# Patient Record
Sex: Female | Born: 1985 | Race: Asian | Hispanic: No | Marital: Married | Smoking: Never smoker
Health system: Southern US, Community
[De-identification: ages and names within clinical notes are randomized; demographics above are authoritative.]

## PROBLEM LIST (undated history)

## (undated) ENCOUNTER — Inpatient Hospital Stay (HOSPITAL_COMMUNITY): Payer: Self-pay

## (undated) DIAGNOSIS — Z789 Other specified health status: Secondary | ICD-10-CM

## (undated) HISTORY — PX: NO PAST SURGERIES: SHX2092

---

## 2016-11-24 ENCOUNTER — Ambulatory Visit
Admission: RE | Admit: 2016-11-24 | Discharge: 2016-11-24 | Disposition: A | Discharge status: Home / Self Care | Payer: No Typology Code available for payment source | Source: Ambulatory Visit | Attending: Internal Medicine | Admitting: Internal Medicine

## 2016-11-24 ENCOUNTER — Other Ambulatory Visit: Payer: Self-pay | Admitting: Internal Medicine

## 2016-11-24 DIAGNOSIS — R7611 Nonspecific reaction to tuberculin skin test without active tuberculosis: Secondary | ICD-10-CM

## 2018-03-07 LAB — OB RESULTS CONSOLE RUBELLA ANTIBODY, IGM: Rubella: IMMUNE

## 2018-03-07 LAB — CULTURE, OB URINE: Urine Culture, OB: NEGATIVE

## 2018-03-07 LAB — CYSTIC FIBROSIS DIAGNOSTIC STUDY: INTERPRETATION-CFDNA: NEGATIVE

## 2018-03-07 LAB — OB RESULTS CONSOLE HIV ANTIBODY (ROUTINE TESTING): HIV: NONREACTIVE

## 2018-03-07 LAB — OB RESULTS CONSOLE RPR: RPR: NONREACTIVE

## 2018-03-07 LAB — GLUCOSE TOLERANCE, 1 HOUR: GLUCOSE 1 HOUR GTT: 119

## 2018-03-07 LAB — OB RESULTS CONSOLE GC/CHLAMYDIA
Chlamydia: NEGATIVE
Gonorrhea: NEGATIVE

## 2018-03-07 LAB — OB RESULTS CONSOLE ABO/RH: "RH Type ": POSITIVE

## 2018-03-07 LAB — OB RESULTS CONSOLE VARICELLA ZOSTER ANTIBODY, IGG: Varicella: IMMUNE

## 2018-03-07 LAB — OB RESULTS CONSOLE HGB/HCT, BLOOD
HCT: 35 (ref 29–41)
Hemoglobin: 11.8

## 2018-03-07 LAB — SICKLE CELL SCREEN: SICKLE CELL SCREEN: NORMAL

## 2018-03-07 LAB — OB RESULTS CONSOLE ANTIBODY SCREEN: Antibody Screen: NEGATIVE

## 2018-03-07 LAB — OB RESULTS CONSOLE HEPATITIS B SURFACE ANTIGEN: HEP B S AG: NEGATIVE

## 2018-03-07 LAB — CYTOLOGY - PAP: Pap: NEGATIVE

## 2018-03-07 LAB — OB RESULTS CONSOLE PLATELET COUNT: PLATELETS: 229

## 2018-03-25 ENCOUNTER — Other Ambulatory Visit (HOSPITAL_COMMUNITY): Payer: Self-pay | Admitting: Nurse Practitioner

## 2018-03-25 DIAGNOSIS — Z3A20 20 weeks gestation of pregnancy: Secondary | ICD-10-CM

## 2018-03-25 DIAGNOSIS — O280 Abnormal hematological finding on antenatal screening of mother: Secondary | ICD-10-CM

## 2018-03-25 DIAGNOSIS — R1900 Intra-abdominal and pelvic swelling, mass and lump, unspecified site: Secondary | ICD-10-CM

## 2018-03-25 DIAGNOSIS — Z3689 Encounter for other specified antenatal screening: Secondary | ICD-10-CM

## 2018-03-27 ENCOUNTER — Encounter (HOSPITAL_COMMUNITY): Payer: Self-pay | Admitting: *Deleted

## 2018-03-29 ENCOUNTER — Ambulatory Visit (HOSPITAL_COMMUNITY)
Admission: RE | Admit: 2018-03-29 | Discharge: 2018-03-29 | Disposition: A | Discharge status: Home / Self Care | Payer: Medicaid Other | Source: Ambulatory Visit | Attending: Nurse Practitioner | Admitting: Nurse Practitioner

## 2018-03-29 ENCOUNTER — Encounter (HOSPITAL_COMMUNITY): Payer: Self-pay

## 2018-04-05 ENCOUNTER — Ambulatory Visit (HOSPITAL_COMMUNITY): Payer: Self-pay

## 2018-04-05 ENCOUNTER — Other Ambulatory Visit (HOSPITAL_COMMUNITY): Payer: Self-pay | Admitting: Obstetrics and Gynecology

## 2018-04-05 ENCOUNTER — Other Ambulatory Visit (HOSPITAL_COMMUNITY): Payer: Self-pay | Admitting: *Deleted

## 2018-04-05 ENCOUNTER — Other Ambulatory Visit (HOSPITAL_COMMUNITY): Payer: Self-pay

## 2018-04-05 ENCOUNTER — Ambulatory Visit (HOSPITAL_COMMUNITY)
Admission: RE | Admit: 2018-04-05 | Discharge: 2018-04-05 | Disposition: A | Discharge status: Home / Self Care | Payer: Medicaid Other | Source: Ambulatory Visit | Attending: Nurse Practitioner | Admitting: Nurse Practitioner

## 2018-04-05 ENCOUNTER — Other Ambulatory Visit (HOSPITAL_COMMUNITY): Payer: Self-pay | Admitting: Nurse Practitioner

## 2018-04-05 ENCOUNTER — Encounter (HOSPITAL_COMMUNITY): Payer: Self-pay

## 2018-04-05 ENCOUNTER — Other Ambulatory Visit: Payer: Self-pay

## 2018-04-05 ENCOUNTER — Ambulatory Visit (HOSPITAL_BASED_OUTPATIENT_CLINIC_OR_DEPARTMENT_OTHER)
Admission: RE | Admit: 2018-04-05 | Discharge: 2018-04-05 | Disposition: A | Discharge status: Home / Self Care | Payer: Medicaid Other | Source: Ambulatory Visit | Attending: Nurse Practitioner | Admitting: Nurse Practitioner

## 2018-04-05 DIAGNOSIS — Z3689 Encounter for other specified antenatal screening: Secondary | ICD-10-CM

## 2018-04-05 DIAGNOSIS — O289 Unspecified abnormal findings on antenatal screening of mother: Secondary | ICD-10-CM | POA: Diagnosis not present

## 2018-04-05 DIAGNOSIS — Z363 Encounter for antenatal screening for malformations: Secondary | ICD-10-CM | POA: Diagnosis not present

## 2018-04-05 DIAGNOSIS — N1330 Unspecified hydronephrosis: Secondary | ICD-10-CM

## 2018-04-05 DIAGNOSIS — Z3A20 20 weeks gestation of pregnancy: Secondary | ICD-10-CM

## 2018-04-05 DIAGNOSIS — O280 Abnormal hematological finding on antenatal screening of mother: Secondary | ICD-10-CM

## 2018-04-05 DIAGNOSIS — O35EXX0 Maternal care for other (suspected) fetal abnormality and damage, fetal genitourinary anomalies, not applicable or unspecified: Secondary | ICD-10-CM

## 2018-04-05 DIAGNOSIS — Z3A22 22 weeks gestation of pregnancy: Secondary | ICD-10-CM | POA: Insufficient documentation

## 2018-04-05 DIAGNOSIS — O281 Abnormal biochemical finding on antenatal screening of mother: Secondary | ICD-10-CM | POA: Insufficient documentation

## 2018-04-05 DIAGNOSIS — O359XX0 Maternal care for (suspected) fetal abnormality and damage, unspecified, not applicable or unspecified: Secondary | ICD-10-CM

## 2018-04-05 DIAGNOSIS — O358XX0 Maternal care for other (suspected) fetal abnormality and damage, not applicable or unspecified: Secondary | ICD-10-CM

## 2018-04-05 DIAGNOSIS — R1900 Intra-abdominal and pelvic swelling, mass and lump, unspecified site: Secondary | ICD-10-CM

## 2018-04-05 HISTORY — DX: Other specified health status: Z78.9

## 2018-04-08 ENCOUNTER — Other Ambulatory Visit (HOSPITAL_COMMUNITY): Payer: Self-pay

## 2018-04-08 ENCOUNTER — Telehealth (HOSPITAL_COMMUNITY): Payer: Self-pay | Admitting: *Deleted

## 2018-04-08 ENCOUNTER — Other Ambulatory Visit (HOSPITAL_COMMUNITY): Payer: Self-pay | Admitting: Nurse Practitioner

## 2018-04-08 ENCOUNTER — Other Ambulatory Visit (HOSPITAL_COMMUNITY): Payer: Self-pay | Admitting: *Deleted

## 2018-04-08 DIAGNOSIS — O35EXX1 Maternal care for other (suspected) fetal abnormality and damage, fetal genitourinary anomalies, fetus 1: Secondary | ICD-10-CM

## 2018-04-08 DIAGNOSIS — O358XX1 Maternal care for other (suspected) fetal abnormality and damage, fetus 1: Secondary | ICD-10-CM

## 2018-04-08 LAB — HEMOGLOBINOPATHY EVALUATION
HGB S QUANTITAION: 0 %
HGB VARIANT: 0 %
Hgb A2 Quant: 2.5 % (ref 1.8–3.2)
Hgb A: 95.6 % — ABNORMAL LOW (ref 96.4–98.8)
Hgb C: 0 %
Hgb F Quant: 1.9 % (ref 0.0–2.0)

## 2018-04-08 NOTE — Telephone Encounter (Signed)
Name and DOB verified using [place] interpreter [phone].  Normal FISH result given.  Pt voiced understanding.

## 2018-04-11 ENCOUNTER — Encounter (HOSPITAL_COMMUNITY): Payer: Self-pay

## 2018-04-26 ENCOUNTER — Other Ambulatory Visit (HOSPITAL_COMMUNITY): Payer: Self-pay | Admitting: Nurse Practitioner

## 2018-05-03 ENCOUNTER — Encounter (HOSPITAL_COMMUNITY): Payer: Self-pay

## 2018-05-03 ENCOUNTER — Ambulatory Visit (HOSPITAL_COMMUNITY)
Admission: RE | Admit: 2018-05-03 | Discharge: 2018-05-03 | Disposition: A | Discharge status: Home / Self Care | Payer: Medicaid Other | Source: Ambulatory Visit | Attending: Nurse Practitioner | Admitting: Nurse Practitioner

## 2018-05-03 DIAGNOSIS — O289 Unspecified abnormal findings on antenatal screening of mother: Secondary | ICD-10-CM

## 2018-05-03 DIAGNOSIS — O9989 Other specified diseases and conditions complicating pregnancy, childbirth and the puerperium: Secondary | ICD-10-CM | POA: Insufficient documentation

## 2018-05-03 DIAGNOSIS — Z3A26 26 weeks gestation of pregnancy: Secondary | ICD-10-CM | POA: Insufficient documentation

## 2018-05-03 DIAGNOSIS — O358XX0 Maternal care for other (suspected) fetal abnormality and damage, not applicable or unspecified: Secondary | ICD-10-CM | POA: Insufficient documentation

## 2018-05-03 DIAGNOSIS — O35EXX0 Maternal care for other (suspected) fetal abnormality and damage, fetal genitourinary anomalies, not applicable or unspecified: Secondary | ICD-10-CM

## 2018-05-03 DIAGNOSIS — N1330 Unspecified hydronephrosis: Secondary | ICD-10-CM | POA: Diagnosis not present

## 2018-05-03 DIAGNOSIS — O359XX0 Maternal care for (suspected) fetal abnormality and damage, unspecified, not applicable or unspecified: Secondary | ICD-10-CM | POA: Diagnosis not present

## 2018-05-06 ENCOUNTER — Other Ambulatory Visit (HOSPITAL_COMMUNITY): Payer: Self-pay | Admitting: *Deleted

## 2018-05-06 DIAGNOSIS — O281 Abnormal biochemical finding on antenatal screening of mother: Secondary | ICD-10-CM

## 2018-05-17 ENCOUNTER — Inpatient Hospital Stay (HOSPITAL_COMMUNITY)
Admission: AD | Admit: 2018-05-17 | Discharge: 2018-05-17 | Disposition: A | Discharge status: Home / Self Care | Payer: Medicaid Other | Attending: Obstetrics and Gynecology | Admitting: Obstetrics and Gynecology

## 2018-05-17 ENCOUNTER — Encounter (HOSPITAL_COMMUNITY): Payer: Self-pay | Admitting: *Deleted

## 2018-05-17 ENCOUNTER — Other Ambulatory Visit: Payer: Self-pay

## 2018-05-17 DIAGNOSIS — O26893 Other specified pregnancy related conditions, third trimester: Secondary | ICD-10-CM

## 2018-05-17 DIAGNOSIS — R030 Elevated blood-pressure reading, without diagnosis of hypertension: Secondary | ICD-10-CM

## 2018-05-17 DIAGNOSIS — Z3A28 28 weeks gestation of pregnancy: Secondary | ICD-10-CM | POA: Diagnosis not present

## 2018-05-17 DIAGNOSIS — O169 Unspecified maternal hypertension, unspecified trimester: Secondary | ICD-10-CM | POA: Insufficient documentation

## 2018-05-17 DIAGNOSIS — I10 Essential (primary) hypertension: Secondary | ICD-10-CM

## 2018-05-17 LAB — PROTEIN / CREATININE RATIO, URINE
CREATININE, URINE: 24 mg/dL
Total Protein, Urine: <6 mg/dL

## 2018-05-17 LAB — COMPREHENSIVE METABOLIC PANEL
ALK PHOS: 67 U/L (ref 38–126)
ALT: 20 U/L (ref 0–44)
AST: 21 U/L (ref 15–41)
Albumin: 3.5 g/dL (ref 3.5–5.0)
Anion gap: 8 (ref 5–15)
CALCIUM: 8.6 mg/dL — AB (ref 8.9–10.3)
CO2: 21 mmol/L — ABNORMAL LOW (ref 22–32)
CREATININE: 0.37 mg/dL — AB (ref 0.44–1.00)
Chloride: 105 mmol/L (ref 98–111)
Glucose, Bld: 110 mg/dL — ABNORMAL HIGH (ref 70–99)
Potassium: 3 mmol/L — ABNORMAL LOW (ref 3.5–5.1)
Sodium: 134 mmol/L — ABNORMAL LOW (ref 135–145)
Total Bilirubin: 0.4 mg/dL (ref 0.3–1.2)
Total Protein: 7.1 g/dL (ref 6.5–8.1)

## 2018-05-17 LAB — CBC
HEMATOCRIT: 34.6 % — AB (ref 36.0–46.0)
HEMOGLOBIN: 12.1 g/dL (ref 12.0–15.0)
MCH: 31.9 pg (ref 26.0–34.0)
MCHC: 35 g/dL (ref 30.0–36.0)
MCV: 91.3 fL (ref 80.0–100.0)
NRBC: 0 % (ref 0.0–0.2)
PLATELETS: 186 10*3/uL (ref 150–400)
RBC: 3.79 MIL/uL — AB (ref 3.87–5.11)
RDW: 13.9 % (ref 11.5–15.5)
WBC: 11.3 10*3/uL — ABNORMAL HIGH (ref 4.0–10.5)

## 2018-05-17 LAB — GLUCOSE TOLERANCE, 1 HOUR: Glucose, 1 Hour GTT: 140

## 2018-05-17 NOTE — MAU Provider Note (Signed)
  History     CSN: [id number]  Arrival date and time: 05/17/18 1047   First Provider Initiated Contact with Patient 05/17/18 1140      Chief Complaint  Patient presents with  . Headache   Presents from HD for elevated blood pressures This morning in clinic BP was 158/97 and 140/80 Denies any history of high blood pressures No family history of HTN or gestational HTN/PreE Denies HA, vision changes, epigastric or RUQ pain, N/V   Past Medical History:  Diagnosis Date  . Medical history non-contributory     Past Surgical History:  Procedure Laterality Date  . NO PAST SURGERIES      History reviewed. No pertinent family history.  Social History   Tobacco Use  . Smoking status: Never Smoker  . Smokeless tobacco: Never Used  Substance Use Topics  . Alcohol use: Not Currently    Frequency: Never  . Drug use: Never    Allergies: No Known Allergies  Medications Prior to Admission  Medication Sig Dispense Refill Last Dose  . Prenatal Vit-Fe Fumarate-FA (PRENATAL VITAMIN PO) Take by mouth.   Taking    Review of Systems  All other systems reviewed and are negative.  Physical Exam   Blood pressure 125/81, pulse (!) 124, temperature 98.5 F (36.9 C), temperature source Oral, resp. rate 17, weight 51.1 kg, last menstrual period 10/29/2017, SpO2 100 %.  Physical Exam  Constitutional: She is oriented to person, place, and time. She appears well-developed and well-nourished. No distress.  HENT:  Head: Normocephalic and atraumatic.  Eyes: Pupils are equal, round, and reactive to light. Right eye exhibits no discharge. Left eye exhibits no discharge. No scleral icterus.  Cardiovascular: Normal rate.  Respiratory: Effort normal. No respiratory distress.  Neurological: She is alert and oriented to person, place, and time.  Skin: She is not diaphoretic.  Psychiatric: She has a normal mood and affect. Her behavior is normal. Judgment and thought content normal.  Strip:  baseline 150s/mod var/+ acels/no decels/uterine irritability   MAU Course  Procedures  MDM Normal blood pressures during monitoring No symptoms concerning for severe preE Labs with normal AST/ALT/platelets/creatinine and UPC  Assessment and Plan  Elevated blood pressure reading in office with diagnosis of hypertension - Plan: Discharge patient - monitoring and workup WNL - strict return precautions given - follow up as scheduled or PRN    Gwenevere AbbotNimeka Walker Sitar 05/17/2018, 11:44 AM

## 2018-05-17 NOTE — MAU Note (Signed)
At prenatal appt today, BP was high. Denies HA< visual changes, epigastric pain or increase in swelling.

## 2018-05-20 LAB — GLUCOSE TOLERANCE, 3 HOURS
Glucose, GTT - 1 Hour: 129 (ref ?–200)
Glucose, GTT - 2 Hour: 115 (ref ?–140)
Glucose, GTT - 3 Hour: 102 mg/dL (ref ?–140)
Glucose, GTT - Fasting: 74 mg/dL — AB (ref 80–110)

## 2018-05-28 ENCOUNTER — Encounter: Payer: Self-pay | Admitting: General Practice

## 2018-05-28 DIAGNOSIS — O139 Gestational [pregnancy-induced] hypertension without significant proteinuria, unspecified trimester: Secondary | ICD-10-CM | POA: Insufficient documentation

## 2018-05-28 DIAGNOSIS — O0990 Supervision of high risk pregnancy, unspecified, unspecified trimester: Secondary | ICD-10-CM | POA: Insufficient documentation

## 2018-05-28 DIAGNOSIS — O133 Gestational [pregnancy-induced] hypertension without significant proteinuria, third trimester: Secondary | ICD-10-CM

## 2018-05-29 NOTE — L&D Delivery Note (Signed)
Patient: Crystal Powers MRN: [id number]  GBS status: negative, IAP given: not indicated  Patient is a 33 y.o. now G1P1 s/p NSVD at [redacted]w[redacted]d, who was admitted for IOL for gHTN. AROM 13h 61m prior to delivery with clear fluid.    Delivery Note At 9:14 AM a viable female was delivered via Vaginal, Spontaneous (Presentation: cephalic; ROA).  APGAR: 9, 9; weight: pending (appears AGA but small).   Placenta status: intact, 3-vessel cord.  Cord:  with the following complications: none.  Cord pH: not sent.   Head delivered ROA. No nuchal cord present. Shoulder and body delivered in usual fashion. Infant with spontaneous cry, placed on mother's abdomen, dried and bulb suctioned. Cord clamped x 2 after 1-minute delay, and cut by family member. Cord blood drawn. Placenta delivered spontaneously with gentle cord traction. Fundus firm with massage and Pitocin. Perineum inspected and found to have 3A laceration, which was repaired with the assistance of Dr. Alvester Morin with good hemostasis achieved.  Anesthesia:  Epidural Episiotomy: None Lacerations: 3rd degree Suture Repair: 3.0 vicryl Est. Blood Loss (mL):  249  Mom to postpartum.  Baby to Couplet care / Skin to Skin.  Gwenevere Abbot 07/17/2018, 10:04 AM

## 2018-05-30 ENCOUNTER — Encounter: Payer: Self-pay | Admitting: *Deleted

## 2018-05-31 ENCOUNTER — Other Ambulatory Visit (HOSPITAL_COMMUNITY): Payer: Self-pay | Admitting: Maternal & Fetal Medicine

## 2018-05-31 ENCOUNTER — Ambulatory Visit (HOSPITAL_COMMUNITY)
Admission: RE | Admit: 2018-05-31 | Discharge: 2018-05-31 | Disposition: A | Discharge status: Home / Self Care | Payer: Medicaid Other | Source: Ambulatory Visit | Attending: Nurse Practitioner | Admitting: Nurse Practitioner

## 2018-05-31 ENCOUNTER — Encounter (HOSPITAL_COMMUNITY): Payer: Self-pay

## 2018-05-31 ENCOUNTER — Inpatient Hospital Stay (HOSPITAL_COMMUNITY)
Admission: AD | Admit: 2018-05-31 | Discharge: 2018-05-31 | Disposition: A | Discharge status: Home / Self Care | Payer: Medicaid Other | Source: Ambulatory Visit | Attending: Obstetrics & Gynecology | Admitting: Obstetrics & Gynecology

## 2018-05-31 DIAGNOSIS — O281 Abnormal biochemical finding on antenatal screening of mother: Secondary | ICD-10-CM

## 2018-05-31 DIAGNOSIS — O289 Unspecified abnormal findings on antenatal screening of mother: Secondary | ICD-10-CM

## 2018-05-31 DIAGNOSIS — O359XX0 Maternal care for (suspected) fetal abnormality and damage, unspecified, not applicable or unspecified: Secondary | ICD-10-CM

## 2018-05-31 DIAGNOSIS — Z3A30 30 weeks gestation of pregnancy: Secondary | ICD-10-CM | POA: Insufficient documentation

## 2018-05-31 DIAGNOSIS — O0990 Supervision of high risk pregnancy, unspecified, unspecified trimester: Secondary | ICD-10-CM

## 2018-05-31 DIAGNOSIS — O133 Gestational [pregnancy-induced] hypertension without significant proteinuria, third trimester: Secondary | ICD-10-CM

## 2018-05-31 DIAGNOSIS — R030 Elevated blood-pressure reading, without diagnosis of hypertension: Secondary | ICD-10-CM | POA: Diagnosis present

## 2018-05-31 LAB — COMPREHENSIVE METABOLIC PANEL
ALBUMIN: 3.3 g/dL — AB (ref 3.5–5.0)
ALT: 15 U/L (ref 0–44)
ANION GAP: 7 (ref 5–15)
AST: 17 U/L (ref 15–41)
Alkaline Phosphatase: 82 U/L (ref 38–126)
BUN: 7 mg/dL (ref 6–20)
CHLORIDE: 106 mmol/L (ref 98–111)
CO2: 21 mmol/L — AB (ref 22–32)
Calcium: 9.3 mg/dL (ref 8.9–10.3)
Creatinine, Ser: 0.33 mg/dL — ABNORMAL LOW (ref 0.44–1.00)
GFR calc Af Amer: >60 mL/min (ref >60)
GFR calc non Af Amer: >60 mL/min (ref >60)
Glucose, Bld: 106 mg/dL — ABNORMAL HIGH (ref 70–99)
POTASSIUM: 3.3 mmol/L — AB (ref 3.5–5.1)
SODIUM: 134 mmol/L — AB (ref 135–145)
TOTAL PROTEIN: 6.6 g/dL (ref 6.5–8.1)
Total Bilirubin: 0.7 mg/dL (ref 0.3–1.2)

## 2018-05-31 LAB — PROTEIN / CREATININE RATIO, URINE
Creatinine, Urine: 21 mg/dL
Total Protein, Urine: <6 mg/dL

## 2018-05-31 LAB — CBC
HEMATOCRIT: 37.1 % (ref 36.0–46.0)
HEMOGLOBIN: 12.6 g/dL (ref 12.0–15.0)
MCH: 31.7 pg (ref 26.0–34.0)
MCHC: 34 g/dL (ref 30.0–36.0)
MCV: 93.5 fL (ref 80.0–100.0)
Platelets: 204 10*3/uL (ref 150–400)
RBC: 3.97 MIL/uL (ref 3.87–5.11)
RDW: 13.7 % (ref 11.5–15.5)
WBC: 10.1 10*3/uL (ref 4.0–10.5)
nRBC: 0 % (ref 0.0–0.2)

## 2018-05-31 LAB — ABO/RH: ABO/RH(D): O POS

## 2018-05-31 LAB — TYPE AND SCREEN
ABO/RH(D): O POS
Antibody Screen: NEGATIVE

## 2018-05-31 LAB — URINALYSIS, ROUTINE W REFLEX MICROSCOPIC
Bilirubin Urine: NEGATIVE
Glucose, UA: NEGATIVE mg/dL
Hgb urine dipstick: NEGATIVE
Ketones, ur: NEGATIVE mg/dL
Leukocytes, UA: NEGATIVE
Nitrite: NEGATIVE
PROTEIN: NEGATIVE mg/dL
Specific Gravity, Urine: 1.005 (ref 1.005–1.030)
pH: 7 (ref 5.0–8.0)

## 2018-05-31 MED ORDER — LABETALOL HCL 5 MG/ML IV SOLN: 80.0000 mg | INTRAVENOUS | Status: DC | PRN

## 2018-05-31 MED ORDER — LABETALOL HCL 5 MG/ML IV SOLN: 20.0000 mg | INTRAVENOUS | Status: DC | PRN

## 2018-05-31 MED ORDER — HYDRALAZINE HCL 20 MG/ML IJ SOLN: 10.0000 mg | INTRAMUSCULAR | Status: DC | PRN

## 2018-05-31 MED ORDER — LABETALOL HCL 5 MG/ML IV SOLN: 40.0000 mg | INTRAVENOUS | Status: DC | PRN

## 2018-05-31 NOTE — Discharge Instructions (Signed)
.  Preeclampsia and Eclampsia    Preeclampsia is a serious condition that may develop during pregnancy. It is also called toxemia of pregnancy. This condition causes high blood pressure along with other symptoms, such as swelling and headaches. These symptoms may develop as the condition gets worse. Preeclampsia may occur at 20 weeks of pregnancy or later.  Diagnosing and treating preeclampsia early is very important. If not treated early, it can cause serious problems for you and your baby. One problem it can lead to is eclampsia. Eclampsia is a condition that causes muscle jerking or shaking (convulsions or seizures) and other serious problems for the mother. During pregnancy, delivering your baby may be the best treatment for preeclampsia or eclampsia. For most women, preeclampsia and eclampsia symptoms go away after giving birth.  In rare cases, a woman may develop preeclampsia after giving birth (postpartum preeclampsia). This usually occurs within 48 hours after childbirth but may occur up to 6 weeks after giving birth.  What are the causes?  The cause of preeclampsia is not known.  What increases the risk?  The following risk factors make you more likely to develop preeclampsia:   Being pregnant for the first time.   Having had preeclampsia during a past pregnancy.   Having a family history of preeclampsia.   Having high blood pressure.   Being pregnant with more than one baby.   Being 35 or older.   Being African-American.   Having kidney disease or diabetes.   Having medical conditions such as lupus or blood diseases.   Being very overweight (obese).  What are the signs or symptoms?  The earliest signs of preeclampsia are:   High blood pressure.   Increased protein in your urine. Your health care provider will check for this at every visit before you give birth (prenatal visit).  Other symptoms that may develop as the condition gets worse include:   Severe headaches.   Sudden weight  gain.   Swelling of the hands, face, legs, and feet.   Nausea and vomiting.   Vision problems, such as blurred or double vision.   Numbness in the face, arms, legs, and feet.   Urinating less than usual.   Dizziness.   Slurred speech.   Abdominal pain, especially upper abdominal pain.   Convulsions or seizures.  How is this diagnosed?  There are no screening tests for preeclampsia. Your health care provider will ask you about symptoms and check for signs of preeclampsia during your prenatal visits. You may also have tests that include:   Urine tests.   Blood tests.   Checking your blood pressure.   Monitoring your baby's heart rate.   Ultrasound.  How is this treated?  You and your health care provider will determine the treatment approach that is best for you. Treatment may include:   Having more frequent prenatal exams to check for signs of preeclampsia, if you have an increased risk for preeclampsia.   Medicine to lower your blood pressure.   Staying in the hospital, if your condition is severe. There, treatment will focus on controlling your blood pressure and the amount of fluids in your body (fluid retention).   Taking medicine (magnesium sulfate) to prevent seizures. This may be given as an injection or through an IV.   Taking a low-dose aspirin during your pregnancy.   Delivering your baby early, if your condition gets worse. You may have your labor started with medicine (induced), or you may have a cesarean   delivery.  Follow these instructions at home:  Eating and drinking     Drink enough fluid to keep your urine pale yellow.   Avoid caffeine.  Lifestyle   Do not use any products that contain nicotine or tobacco, such as cigarettes and e-cigarettes. If you need help quitting, ask your health care provider.   Do not use alcohol or drugs.   Avoid stress as much as possible. Rest and get plenty of sleep.  General instructions   Take over-the-counter and prescription medicines only as  told by your health care provider.   When lying down, lie on your left side. This keeps pressure off your major blood vessels.   When sitting or lying down, raise (elevate) your feet. Try putting some pillows underneath your lower legs.   Exercise regularly. Ask your health care provider what kinds of exercise are best for you.   Keep all follow-up and prenatal visits as told by your health care provider. This is important.  How is this prevented?  There is no known way of preventing preeclampsia or eclampsia from developing. However, to lower your risk of complications and detect problems early:   Get regular prenatal care. Your health care provider may be able to diagnose and treat the condition early.   Maintain a healthy weight. Ask your health care provider for help managing weight gain during pregnancy.   Work with your health care provider to manage any long-term (chronic) health conditions you have, such as diabetes or kidney problems.   You may have tests of your blood pressure and kidney function after giving birth.   Your health care provider may have you take low-dose aspirin during your next pregnancy.  Contact a health care provider if:   You have symptoms that your health care provider told you may require more treatment or monitoring, such as:  ? Headaches.  ? Nausea or vomiting.  ? Abdominal pain.  ? Dizziness.  ? Light-headedness.  Get help right away if:   You have severe:  ? Abdominal pain.  ? Headaches that do not get better.  ? Dizziness.  ? Vision problems.  ? Confusion.  ? Nausea or vomiting.   You have any of the following:  ? A seizure.  ? Sudden, rapid weight gain.  ? Sudden swelling in your hands, ankles, or face.  ? Trouble moving any part of your body.  ? Numbness in any part of your body.  ? Trouble speaking.  ? Abnormal bleeding.   You faint.  Summary   Preeclampsia is a serious condition that may develop during pregnancy. It is also called toxemia of pregnancy.   This  condition causes high blood pressure along with other symptoms, such as swelling and headaches.   Diagnosing and treating preeclampsia early is very important. If not treated early, it can cause serious problems for you and your baby.   Get help right away if you have symptoms that your health care provider told you to watch for.  This information is not intended to replace advice given to you by your health care provider. Make sure you discuss any questions you have with your health care provider.  Document Released: 05/12/2000 Document Revised: 05/01/2017 Document Reviewed: 12/20/2015  Elsevier Interactive Patient Education  2019 [employer]vier Inc[employer]

## 2018-05-31 NOTE — MAU Note (Signed)
Report given to Ginger Morris, RN in MAU.  Pt transported via wheelchair for further evaluation per Dr. Judeth Cornfield in [facility].

## 2018-05-31 NOTE — MAU Provider Note (Signed)
History     CSN: [id number]  Arrival date and time: 05/31/18 1509   First Provider Initiated Contact with Patient 05/31/18 1537      Chief Complaint  Patient presents with  . Hypertension   HPI Crystal Powers is a 33 y.o. G1P0000 at [redacted]w[redacted]d who presents from MFM for evaluation of elevated BP. She denies any headache, visual changes or epigastric pain. She had severe range BP at her appointment. She denies any abdominal pain, vaginal bleeding or leaking of fluid. Reports normal fetal movement.   OB History    Gravida  1   Para  0   Term  0   Preterm  0   AB  0   Living  0     SAB  0   TAB  0   Ectopic  0   Multiple  0   Live Births  0           Past Medical History:  Diagnosis Date  . Medical history non-contributory     Past Surgical History:  Procedure Laterality Date  . NO PAST SURGERIES      Family History  Problem Relation Age of Onset  . Hypertension Mother     Social History   Tobacco Use  . Smoking status: Never Smoker  . Smokeless tobacco: Never Used  Substance Use Topics  . Alcohol use: Not Currently    Frequency: Never  . Drug use: Never    Allergies: No Known Allergies  Medications Prior to Admission  Medication Sig Dispense Refill Last Dose  . Prenatal Vit-Fe Fumarate-FA (PRENATAL VITAMIN PO) Take by mouth.   Taking    Review of Systems  Constitutional: Negative.  Negative for fatigue and fever.  HENT: Negative.   Respiratory: Negative.  Negative for shortness of breath.   Cardiovascular: Negative.  Negative for chest pain.  Gastrointestinal: Negative.  Negative for abdominal pain, constipation, diarrhea, nausea and vomiting.  Genitourinary: Negative.  Negative for dysuria.  Neurological: Negative.  Negative for dizziness and headaches.   Physical Exam   Blood pressure (!) 130/95, pulse (!) 122, last menstrual period 10/29/2017, SpO2 95 %. Patient Vitals for the past 24 hrs:  BP Pulse SpO2  05/31/18 1631 117/76 (!)  117 -  05/31/18 1615 112/76 (!) 111 96 %  05/31/18 1610 - - 96 %  05/31/18 1605 - - 96 %  05/31/18 1600 114/80 (!) 119 95 %  05/31/18 1555 - - 95 %  05/31/18 1546 112/79 (!) 107 -  05/31/18 1545 - - 96 %  05/31/18 1535 - - 95 %  05/31/18 1530 (!) 130/95 (!) 122 96 %  05/31/18 1525 132/89 (!) 116 96 %  05/31/18 1524 - - 97 %   Physical Exam  Nursing note and vitals reviewed. Constitutional: She is oriented to person, place, and time. She appears well-developed and well-nourished. No distress.  HENT:  Head: Normocephalic.  Eyes: Pupils are equal, round, and reactive to light.  Cardiovascular: Normal rate, regular rhythm and normal heart sounds.  Respiratory: Effort normal and breath sounds normal. No respiratory distress.  GI: Soft. Bowel sounds are normal. She exhibits no distension. There is no abdominal tenderness.  Neurological: She is alert and oriented to person, place, and time. She has normal reflexes. She displays normal reflexes. She exhibits normal muscle tone. Coordination normal.  Skin: Skin is warm and dry.  Psychiatric: She has a normal mood and affect. Her behavior is normal. Judgment and thought content normal.  Fetal Tracing:  Baseline: 150 Variability: moderate Accels: 10x10 Decels: none  Toco: none  MAU Course  Procedures Results for orders placed or performed during the hospital encounter of 05/31/18 (from the past 24 hour(s))  Protein / creatinine ratio, urine     Status: None   Collection Time: 05/31/18  3:21 PM  Result Value Ref Range   Creatinine, Urine 21.00 mg/dL   Total Protein, Urine <6 mg/dL   Protein Creatinine Ratio        0.00 - 0.15 mg/mg[Cre]  Urinalysis, Routine w reflex microscopic     Status: Abnormal   Collection Time: 05/31/18  3:21 PM  Result Value Ref Range   Color, Urine STRAW (A) YELLOW   APPearance CLEAR CLEAR   Specific Gravity, Urine 1.005 1.005 - 1.030   pH 7.0 5.0 - 8.0   Glucose, UA NEGATIVE NEGATIVE mg/dL   Hgb  urine dipstick NEGATIVE NEGATIVE   Bilirubin Urine NEGATIVE NEGATIVE   Ketones, ur NEGATIVE NEGATIVE mg/dL   Protein, ur NEGATIVE NEGATIVE mg/dL   Nitrite NEGATIVE NEGATIVE   Leukocytes, UA NEGATIVE NEGATIVE  Comprehensive metabolic panel     Status: Abnormal   Collection Time: 05/31/18  3:30 PM  Result Value Ref Range   Sodium 134 (L) 135 - 145 mmol/L   Potassium 3.3 (L) 3.5 - 5.1 mmol/L   Chloride 106 98 - 111 mmol/L   CO2 21 (L) 22 - 32 mmol/L   Glucose, Bld 106 (H) 70 - 99 mg/dL   BUN 7 6 - 20 mg/dL   Creatinine, Ser 6.60 (L) 0.44 - 1.00 mg/dL   Calcium 9.3 8.9 - 63.0 mg/dL   Total Protein 6.6 6.5 - 8.1 g/dL   Albumin 3.3 (L) 3.5 - 5.0 g/dL   AST 17 15 - 41 U/L   ALT 15 0 - 44 U/L   Alkaline Phosphatase 82 38 - 126 U/L   Total Bilirubin 0.7 0.3 - 1.2 mg/dL   GFR calc non Af Amer >60 >60 mL/min   GFR calc Af Amer >60 >60 mL/min   Anion gap 7 5 - 15  CBC     Status: None   Collection Time: 05/31/18  3:30 PM  Result Value Ref Range   WBC 10.1 4.0 - 10.5 K/uL   RBC 3.97 3.87 - 5.11 MIL/uL   Hemoglobin 12.6 12.0 - 15.0 g/dL   HCT 16.0 10.9 - 32.3 %   MCV 93.5 80.0 - 100.0 fL   MCH 31.7 26.0 - 34.0 pg   MCHC 34.0 30.0 - 36.0 g/dL   RDW 55.7 32.2 - 02.5 %   Platelets 204 150 - 400 K/uL   nRBC 0.0 0.0 - 0.2 %  Type and screen     Status: None   Collection Time: 05/31/18  3:30 PM  Result Value Ref Range   ABO/RH(D) O POS    Antibody Screen NEG    Sample Expiration      06/03/2018 Performed at [facility], [address] [place], [place] [address]    MDM UA CBC, CMP, Protein/creat ratio  Assessment and Plan   1. [redacted] weeks gestation of pregnancy   2. Gestational hypertension, third trimester    -Discharge home in stable condition -Preeclampsia precautions discussed -Patient advised to follow-up with [facility] as scheduled for prenatal care -Patient may return to MAU as needed or if her condition were to change or worsen  Rolm Bookbinder CNM 05/31/2018, 3:37 PM

## 2018-05-31 NOTE — MAU Note (Addendum)
Pt here for hypertension and tachycardia. Sent from office. No HA, visual changes, or RUQ pain.

## 2018-06-03 ENCOUNTER — Ambulatory Visit (INDEPENDENT_AMBULATORY_CARE_PROVIDER_SITE_OTHER): Payer: Medicaid Other | Admitting: Obstetrics and Gynecology

## 2018-06-03 ENCOUNTER — Encounter: Payer: Self-pay | Admitting: Obstetrics and Gynecology

## 2018-06-03 VITALS — BP 143/92 | HR 110 | Wt 115.2 lb

## 2018-06-03 DIAGNOSIS — Z3A31 31 weeks gestation of pregnancy: Secondary | ICD-10-CM

## 2018-06-03 DIAGNOSIS — O358XX0 Maternal care for other (suspected) fetal abnormality and damage, not applicable or unspecified: Secondary | ICD-10-CM

## 2018-06-03 DIAGNOSIS — Z789 Other specified health status: Secondary | ICD-10-CM

## 2018-06-03 DIAGNOSIS — O133 Gestational [pregnancy-induced] hypertension without significant proteinuria, third trimester: Secondary | ICD-10-CM | POA: Diagnosis not present

## 2018-06-03 DIAGNOSIS — O0993 Supervision of high risk pregnancy, unspecified, third trimester: Secondary | ICD-10-CM | POA: Diagnosis not present

## 2018-06-03 DIAGNOSIS — O99810 Abnormal glucose complicating pregnancy: Secondary | ICD-10-CM | POA: Insufficient documentation

## 2018-06-03 DIAGNOSIS — O0990 Supervision of high risk pregnancy, unspecified, unspecified trimester: Secondary | ICD-10-CM

## 2018-06-03 DIAGNOSIS — O35EXX0 Maternal care for other (suspected) fetal abnormality and damage, fetal genitourinary anomalies, not applicable or unspecified: Secondary | ICD-10-CM | POA: Insufficient documentation

## 2018-06-03 NOTE — Progress Notes (Signed)
Called [facility] and spoke with Linus Orn requesting the rest of the pt's medical records.  Last records sent 04/26/18.  Additional records to be faxed per Linus Orn.

## 2018-06-03 NOTE — Patient Instructions (Signed)
AREA PEDIATRIC/FAMILY PRACTICE PHYSICIANS  [facility] [address], [address] [place], [place]  [address] Phone - [phone]   Fax - [phone]  [facility] [address] [place], [place] [address] Phone - [phone]   Fax - [phone]  [facility] [address] [place], [place]  [address] Phone - [phone]   Fax - [phone]  [facility] [address] [place], [place]  [address] Phone - [phone]   Fax - [phone]  [facility] [address] [place], [place]  [address] Phone - [phone]   Fax - [phone]  [facility] [address] [place], [place]  [address] Phone - [phone]   Fax - [phone]  [facility] [address] [place], [place]  [address] Phone - [phone]   Fax - [phone]  [facility] [address], [address] [place], [place]  [address] Phone - [phone]   Fax - [phone]  [facility] [address] [place], [place]  [address] Phone - [phone]   Fax - [phone] [facility] [address] [place], [place]  [address] Phone - [phone]   Fax - [phone]  [facility] [address] [place], [place]  [address] Phone - [phone]   Fax - [phone]  [facility] [address] [place], [place]  [address] Phone - [phone]   Fax - [phone]  [facility] [address] [place], [place]  [address] Phone - [phone]   Fax - [phone]  [facility] [address] [place], [place]  [address] Phone - [phone]  [facility] [address] [place], [place]  [address] Phone - [phone]   Fax - [phone]  [facility] [address] [place], [place]  [address] Phone - [phone]   Fax - [phone]  [facility] [address] [place], [place]  [address] Phone - [phone]   Fax - [phone]  [facility] [address] [place], [place]  [address] Phone - [phone]   Fax - [phone]   [facility] [address] [place], [place]  [address] Phone - [phone]   Fax - [phone]  [facility] [address] [place], [place]  [address] Phone - [phone]   Fax - [phone]  [facility] R[facility] [address] [place], [place]  [address] Phone - [phone]   Fax - [phone]  [facility] [address] [place], [place]  [address] Phone - [phone]   Fax - [phone]  [facility] [address] , [place]  [address] Phone - [phone]   Fax - [phone] [facility] [address] [place], [place]  [address] Phone - [phone]   Fax - [phone]  [facility] [address] [place], [place]  [address] Phone - [phone]   Fax - [phone]  [facility] [address] [place], [place]  [address] Phone - [phone]   Fax - [phone]  [facility] Charlene Flemming MD [address] [place] [address] Phone [phone]  Fax [phone]   

## 2018-06-03 NOTE — Progress Notes (Signed)
Prenatal Visit Note Date: 06/03/2018 Clinic: [facility]  Transfer of care visit from [facility] due to [facility] diagnosis  Subjective:  Crystal Powers is a 33 y.o. G1P0000 at [redacted]w[redacted]d being seen today for ongoing prenatal care.  She is currently monitored for the following issues for this high-risk pregnancy and has Supervision of high risk pregnancy, antepartum; Gestational hypertension; Left pyelectasis of fetus on prenatal ultrasound; Abnormal glucose affecting pregnancy; and Language barrier on their problem list.  Patient reports no complaints.   Contractions: Not present. Vag. Bleeding: None.  Movement: Present. Denies leaking of fluid.   The following portions of the patient's history were reviewed and updated as appropriate: allergies, current medications, past family history, past medical history, past social history, past surgical history and problem list. Problem list updated.  Objective:   Vitals:   06/03/18 0826 06/03/18 0827  BP: (!) 142/94 (!) 143/92  Pulse: (!) 156 (!) 153  Weight: 115 lb 3.2 oz (52.3 kg)     Fetal Status: Fetal Heart Rate (bpm): 140   Movement: Present     General:  Alert, oriented and cooperative. Patient is in no acute distress.  Skin: Skin is warm and dry. No rash noted.   Cardiovascular: Normal heart rate noted  Respiratory: Normal respiratory effort, no problems with respiration noted  Abdomen: Soft, gravid, appropriate for gestational age. Pain/Pressure: Absent   No ruq pain   Pelvic:  Cervical exam deferred        Extremities: Normal range of motion.  Edema: None  Mental Status: Normal mood and affect. Normal behavior. Normal judgment and thought content.   Urinalysis:      Assessment and Plan:  Pregnancy: G1P0000 at [redacted]w[redacted]d  1. Language barrier interpereter used  2. Supervision of high risk pregnancy, antepartum Routine care. 28wk labs normal  3. Gestational hypertension, third trimester D/w her re: diagnosis and plan of  care. Had growth, bpp last week. Rpt on 1/10 along with labs which I told her I recommend weekly, as well. I told her that if she only has gHTN then delivery would be at 37wks. I told her s/s to be on the look out for and to come to hospital if has any  4. Left pyelectasis of fetus on prenatal ultrasound F/u on rpt u/s  Preterm labor symptoms and general obstetric precautions including but not limited to vaginal bleeding, contractions, leaking of fluid and fetal movement were reviewed in detail with the patient. Please refer to After Visit Summary for other counseling recommendations.  Return in about 4 days (around 06/07/2018) for hrob, nst, bpp, labwork.   Arbuckle Bing, MD

## 2018-06-04 ENCOUNTER — Encounter: Payer: Self-pay | Admitting: *Deleted

## 2018-06-06 ENCOUNTER — Ambulatory Visit (INDEPENDENT_AMBULATORY_CARE_PROVIDER_SITE_OTHER): Payer: Medicaid Other | Admitting: Obstetrics & Gynecology

## 2018-06-06 ENCOUNTER — Other Ambulatory Visit: Payer: Medicaid Other

## 2018-06-06 ENCOUNTER — Ambulatory Visit (INDEPENDENT_AMBULATORY_CARE_PROVIDER_SITE_OTHER): Payer: Medicaid Other | Admitting: *Deleted

## 2018-06-06 ENCOUNTER — Ambulatory Visit: Payer: Self-pay

## 2018-06-06 VITALS — BP 148/93 | HR 146 | Wt 115.2 lb

## 2018-06-06 DIAGNOSIS — Z758 Other problems related to medical facilities and other health care: Secondary | ICD-10-CM

## 2018-06-06 DIAGNOSIS — Z789 Other specified health status: Secondary | ICD-10-CM

## 2018-06-06 DIAGNOSIS — O0993 Supervision of high risk pregnancy, unspecified, third trimester: Secondary | ICD-10-CM | POA: Diagnosis present

## 2018-06-06 DIAGNOSIS — O133 Gestational [pregnancy-induced] hypertension without significant proteinuria, third trimester: Secondary | ICD-10-CM

## 2018-06-06 DIAGNOSIS — Z3A31 31 weeks gestation of pregnancy: Secondary | ICD-10-CM | POA: Diagnosis not present

## 2018-06-06 DIAGNOSIS — O0990 Supervision of high risk pregnancy, unspecified, unspecified trimester: Secondary | ICD-10-CM

## 2018-06-06 NOTE — Progress Notes (Signed)
   PRENATAL VISIT NOTE  Subjective:  Crystal Powers is a 33 y.o. G1P0000 at [redacted]w[redacted]d being seen today for ongoing prenatal care.  She is currently monitored for the following issues for this high-risk pregnancy and has Supervision of high risk pregnancy, antepartum; Gestational hypertension; Left pyelectasis of fetus on prenatal ultrasound; Abnormal glucose affecting pregnancy; and Language barrier on their problem list.  Patient reports no complaints.  Contractions: Not present. Vag. Bleeding: None.  Movement: Present. Denies leaking of fluid.   The following portions of the patient's history were reviewed and updated as appropriate: allergies, current medications, past family history, past medical history, past social history, past surgical history and problem list. Problem list updated.  Objective:   Vitals:   06/06/18 0830  BP: (!) 148/93  Pulse: (!) 146  Weight: 115 lb 3.2 oz (52.3 kg)    Fetal Status: Fetal Heart Rate (bpm): NST Fundal Height: 30 cm Movement: Present     General:  Alert, oriented and cooperative. Patient is in no acute distress.  Skin: Skin is warm and dry. No rash noted.   Cardiovascular: Normal heart rate noted  Respiratory: Normal respiratory effort, no problems with respiration noted  Abdomen: Soft, gravid, appropriate for gestational age.  Pain/Pressure: Absent     Pelvic: Cervical exam deferred        Extremities: Normal range of motion.  Edema: None  Mental Status: Normal mood and affect. Normal behavior. Normal judgment and thought content.   Assessment and Plan:  Pregnancy: G1P0000 at [redacted]w[redacted]d  1. Supervision of high risk pregnancy, antepartum   2. Gestational hypertension, third trimester - labetalol 200 mg BID prescribed - CBC, Cmet today - weekly BPP - MFM u/s in about 3 weeks  3. Language barrier - live interpretor present for exam  Preterm labor symptoms and general obstetric precautions including but not limited to vaginal bleeding,  contractions, leaking of fluid and fetal movement were reviewed in detail with the patient. Please refer to After Visit Summary for other counseling recommendations.  Return in about 1 week (around 06/13/2018) for weekly HOB and NST/BPP.  Future Appointments  Date Time Provider Department Center  06/06/2018 11:00 AM [facility] LAB [facility] WOC    Allie Bossier, MD

## 2018-06-06 NOTE — Progress Notes (Signed)
Live interpreter present for encounter. Pt states she is aware of her fast heart rate and this happens several times per week. She denies chest pain or SOB.  Pt also denies H/A or visual disturbances.

## 2018-06-06 NOTE — Progress Notes (Signed)
I have reviewed this chart and agree with the RN/CMA assessment and management.    Devani Odonnel C Garold Sheeler, MD, FACOG Attending Physician, Faculty Practice [facility]  

## 2018-06-06 NOTE — Progress Notes (Signed)

## 2018-06-07 LAB — COMPREHENSIVE METABOLIC PANEL
ALT: 11 IU/L (ref 0–32)
AST: 14 IU/L (ref 0–40)
Albumin/Globulin Ratio: 1.5 (ref 1.2–2.2)
Albumin: 3.9 g/dL (ref 3.5–5.5)
Alkaline Phosphatase: 103 IU/L (ref 39–117)
BUN/Creatinine Ratio: 13 (ref 9–23)
BUN: 5 mg/dL — ABNORMAL LOW (ref 6–20)
Bilirubin Total: 0.2 mg/dL (ref 0.0–1.2)
CO2: 21 mmol/L (ref 20–29)
Calcium: 9.9 mg/dL (ref 8.7–10.2)
Chloride: 102 mmol/L (ref 96–106)
Creatinine, Ser: 0.39 mg/dL — ABNORMAL LOW (ref 0.57–1.00)
GFR calc Af Amer: 158 mL/min/{1.73_m2} (ref >59)
GFR calc non Af Amer: 137 mL/min/{1.73_m2} (ref >59)
Globulin, Total: 2.6 g/dL (ref 1.5–4.5)
Glucose: 73 mg/dL (ref 65–99)
Potassium: 3.5 mmol/L (ref 3.5–5.2)
Sodium: 138 mmol/L (ref 134–144)
Total Protein: 6.5 g/dL (ref 6.0–8.5)

## 2018-06-07 LAB — CBC
Hematocrit: 36.5 % (ref 34.0–46.6)
Hemoglobin: 12.6 g/dL (ref 11.1–15.9)
MCH: 31.7 pg (ref 26.6–33.0)
MCHC: 34.5 g/dL (ref 31.5–35.7)
MCV: 92 fL (ref 79–97)
Platelets: 196 10*3/uL (ref 150–450)
RBC: 3.97 x10E6/uL (ref 3.77–5.28)
RDW: 13.1 % (ref 11.7–15.4)
WBC: 11.5 10*3/uL — ABNORMAL HIGH (ref 3.4–10.8)

## 2018-06-11 ENCOUNTER — Ambulatory Visit (HOSPITAL_COMMUNITY): Payer: Medicaid Other

## 2018-06-13 ENCOUNTER — Encounter: Payer: Self-pay | Admitting: Obstetrics and Gynecology

## 2018-06-13 ENCOUNTER — Other Ambulatory Visit: Payer: Medicaid Other

## 2018-06-13 ENCOUNTER — Ambulatory Visit (INDEPENDENT_AMBULATORY_CARE_PROVIDER_SITE_OTHER): Payer: Medicaid Other | Admitting: *Deleted

## 2018-06-13 ENCOUNTER — Other Ambulatory Visit (HOSPITAL_COMMUNITY): Payer: Self-pay | Admitting: Obstetrics and Gynecology

## 2018-06-13 ENCOUNTER — Encounter (HOSPITAL_COMMUNITY): Payer: Self-pay

## 2018-06-13 ENCOUNTER — Ambulatory Visit (HOSPITAL_COMMUNITY)
Admission: RE | Admit: 2018-06-13 | Discharge: 2018-06-13 | Disposition: A | Discharge status: Home / Self Care | Payer: Medicaid Other | Source: Ambulatory Visit | Attending: Nurse Practitioner | Admitting: Nurse Practitioner

## 2018-06-13 ENCOUNTER — Other Ambulatory Visit (HOSPITAL_COMMUNITY): Payer: Self-pay | Admitting: *Deleted

## 2018-06-13 ENCOUNTER — Ambulatory Visit (INDEPENDENT_AMBULATORY_CARE_PROVIDER_SITE_OTHER): Payer: Medicaid Other | Admitting: Obstetrics and Gynecology

## 2018-06-13 VITALS — BP 145/102 | HR 115 | Wt 115.7 lb

## 2018-06-13 DIAGNOSIS — O139 Gestational [pregnancy-induced] hypertension without significant proteinuria, unspecified trimester: Secondary | ICD-10-CM

## 2018-06-13 DIAGNOSIS — O35EXX1 Maternal care for other (suspected) fetal abnormality and damage, fetal genitourinary anomalies, fetus 1: Secondary | ICD-10-CM

## 2018-06-13 DIAGNOSIS — Z3A32 32 weeks gestation of pregnancy: Secondary | ICD-10-CM

## 2018-06-13 DIAGNOSIS — O133 Gestational [pregnancy-induced] hypertension without significant proteinuria, third trimester: Secondary | ICD-10-CM | POA: Diagnosis not present

## 2018-06-13 DIAGNOSIS — O358XX1 Maternal care for other (suspected) fetal abnormality and damage, fetus 1: Secondary | ICD-10-CM | POA: Diagnosis not present

## 2018-06-13 DIAGNOSIS — O0993 Supervision of high risk pregnancy, unspecified, third trimester: Secondary | ICD-10-CM

## 2018-06-13 DIAGNOSIS — O0990 Supervision of high risk pregnancy, unspecified, unspecified trimester: Secondary | ICD-10-CM

## 2018-06-13 NOTE — Progress Notes (Signed)
Subjective:  Crystal Powers is a 33 y.o. G1P0000 at 106w3d being seen today for ongoing prenatal care.  She is currently monitored for the following issues for this high-risk pregnancy and has Supervision of high risk pregnancy, antepartum; Gestational hypertension; Left pyelectasis of fetus on prenatal ultrasound; Abnormal glucose affecting pregnancy; and Language barrier on their problem list.  Patient reports no complaints.  Contractions: Not present. Vag. Bleeding: None.  Movement: Present. Denies leaking of fluid.   The following portions of the patient's history were reviewed and updated as appropriate: allergies, current medications, past family history, past medical history, past social history, past surgical history and problem list. Problem list updated.  Objective:   Vitals:   06/13/18 0949 06/13/18 0956  BP: (!) 140/95 (!) 145/102  Pulse: (!) 115   Weight: 115 lb 11.2 oz (52.5 kg)     Fetal Status: Fetal Heart Rate (bpm): 153   Movement: Present     General:  Alert, oriented and cooperative. Patient is in no acute distress.  Skin: Skin is warm and dry. No rash noted.   Cardiovascular: Normal heart rate noted  Respiratory: Normal respiratory effort, no problems with respiration noted  Abdomen: Soft, gravid, appropriate for gestational age. Pain/Pressure: Absent     Pelvic:  Cervical exam deferred        Extremities: Normal range of motion.  Edema: None  Mental Status: Normal mood and affect. Normal behavior. Normal judgment and thought content.   Urinalysis:      Assessment and Plan:  Pregnancy: G1P0000 at [redacted]w[redacted]d  1. Supervision of high risk pregnancy, antepartum Stable   2. Gestational hypertension, third trimester No S/Sx of PEC GHTN reviewed with pt, including IOL at 37 weeks Pt not on medications. Explained to pt no indication for BP medication presently S/Sx of PEC reviewed with pt and indications to present to MAU Growth scan 32% 05/31/18 BPP/NSt today Continue  with weekly antenatal testing  Live interrupter used during today's visit  Preterm labor symptoms and general obstetric precautions including but not limited to vaginal bleeding, contractions, leaking of fluid and fetal movement were reviewed in detail with the patient. Please refer to After Visit Summary for other counseling recommendations.  Return in about 1 week (around 06/20/2018) for [facility] and NST/BPP.   Hermina Staggers, MD

## 2018-06-14 LAB — PROTEIN / CREATININE RATIO, URINE
Creatinine, Urine: 23.6 mg/dL
Protein, Ur: 6.4 mg/dL
Protein/Creat Ratio: 271 mg/g creat — ABNORMAL HIGH (ref 0–200)

## 2018-06-19 ENCOUNTER — Ambulatory Visit (INDEPENDENT_AMBULATORY_CARE_PROVIDER_SITE_OTHER): Payer: Medicaid Other | Admitting: Obstetrics & Gynecology

## 2018-06-19 ENCOUNTER — Ambulatory Visit (INDEPENDENT_AMBULATORY_CARE_PROVIDER_SITE_OTHER): Payer: Medicaid Other | Admitting: *Deleted

## 2018-06-19 ENCOUNTER — Ambulatory Visit: Payer: Self-pay

## 2018-06-19 VITALS — BP 142/87 | HR 124 | Wt 116.0 lb

## 2018-06-19 DIAGNOSIS — O133 Gestational [pregnancy-induced] hypertension without significant proteinuria, third trimester: Secondary | ICD-10-CM

## 2018-06-19 DIAGNOSIS — O0993 Supervision of high risk pregnancy, unspecified, third trimester: Secondary | ICD-10-CM | POA: Diagnosis not present

## 2018-06-19 DIAGNOSIS — Z3A33 33 weeks gestation of pregnancy: Secondary | ICD-10-CM | POA: Diagnosis not present

## 2018-06-19 DIAGNOSIS — O358XX0 Maternal care for other (suspected) fetal abnormality and damage, not applicable or unspecified: Secondary | ICD-10-CM | POA: Diagnosis not present

## 2018-06-19 DIAGNOSIS — O0990 Supervision of high risk pregnancy, unspecified, unspecified trimester: Secondary | ICD-10-CM

## 2018-06-19 DIAGNOSIS — O35EXX0 Maternal care for other (suspected) fetal abnormality and damage, fetal genitourinary anomalies, not applicable or unspecified: Secondary | ICD-10-CM

## 2018-06-19 NOTE — Progress Notes (Signed)
Pt declined interpreter today. She denies H/A or visual disturbances. She states her BP's at home are wnl daily.

## 2018-06-19 NOTE — Progress Notes (Signed)
   PRENATAL VISIT NOTE  Subjective:  Crystal Powers is a 33 y.o. G1P0000 at [redacted]w[redacted]d being seen today for ongoing prenatal care.  She is currently monitored for the following issues for this high-risk pregnancy and has Supervision of high risk pregnancy, antepartum; Gestational hypertension; Left pyelectasis of fetus on prenatal ultrasound; Abnormal glucose affecting pregnancy; and Language barrier on their problem list.  Patient reports no complaints. Patient denies any headaches, visual symptoms, RUQ/epigastric pain.   Contractions: Not present. Vag. Bleeding: None.  Movement: Present. Denies leaking of fluid.   The following portions of the patient's history were reviewed and updated as appropriate: allergies, current medications, past family history, past medical history, past social history, past surgical history and problem list. Problem list updated.  Objective:   Vitals:   06/19/18 0845 06/19/18 0846 06/19/18 0916  BP: (!) 155/94 (!) 159/90 (!) 142/87  Pulse: (!) 141 (!) 149 (!) 124  Weight: 116 lb (52.6 kg)      Fetal Status: Fetal Heart Rate (bpm): NST   Movement: Present     General:  Alert, oriented and cooperative. Patient is in no acute distress.  Skin: Skin is warm and dry. No rash noted.   Cardiovascular: Normal heart rate noted  Respiratory: Normal respiratory effort, no problems with respiration noted  Abdomen: Soft, gravid, appropriate for gestational age.  Pain/Pressure: Absent     Pelvic: Cervical exam deferred        Extremities: Normal range of motion.  Edema: None  Mental Status: Normal mood and affect. Normal behavior. Normal judgment and thought content.   Assessment and Plan:  Pregnancy: G1P0000 at [redacted]w[redacted]d  1. Gestational hypertension, third trimester Will recheck labs today, UPC was 271 last week. Severe PEC precautions advised. - CBC - Comprehensive metabolic panel - Protein / creatinine ratio, urine NST performed today was reviewed and was found to be  reactive. Subsequent BPP performed today was also reviewed and was found to be 8/10 (negative for BM). AFI was also normal. Continue recommended antenatal testing and prenatal care.  2. Left pyelectasis of fetus on prenatal ultrasound Follow up scan ordered  3. Supervision of high risk pregnancy, antepartum Preterm labor symptoms and general obstetric precautions including but not limited to vaginal bleeding, contractions, leaking of fluid and fetal movement were reviewed in detail with the patient. Please refer to After Visit Summary for other counseling recommendations.  Return in about 1 week (around 06/26/2018) for *Needs urine each* Please schedule HOB w/Raiyah Speakman @ 1035 and NST @ 1115 (double book) - has [place] @ 0845.  Future Appointments  Date Time Provider Department Center  06/26/2018  8:45 AM WH-MFC [place] 5 WH-MFCUS MFC-US  06/26/2018 10:35 AM Iyona Pehrson, Jethro Bastos, MD WOC-[facility]A WOC  06/26/2018 11:15 AM WOC-[facility] NST WOC-[facility]A WOC    Jaynie Collins, MD

## 2018-06-19 NOTE — Patient Instructions (Signed)
Return to clinic for any scheduled appointments or obstetric concerns, or go to MAU for evaluation  

## 2018-06-19 NOTE — Progress Notes (Signed)
Pt informed that the ultrasound is considered a limited OB ultrasound and is not intended to be a complete ultrasound exam.  Patient also informed that the ultrasound is not being completed with the intent of assessing for fetal or placental anomalies or any pelvic abnormalities.  Explained that the purpose of today's ultrasound is to assess for presentation, BPP and amniotic fluid voume.  Patient acknowledges the purpose of the exam and the limitations of the study.

## 2018-06-20 LAB — CBC
HEMOGLOBIN: 13 g/dL (ref 11.1–15.9)
Hematocrit: 36.8 % (ref 34.0–46.6)
MCH: 32.2 pg (ref 26.6–33.0)
MCHC: 35.3 g/dL (ref 31.5–35.7)
MCV: 91 fL (ref 79–97)
Platelets: 197 10*3/uL (ref 150–450)
RBC: 4.04 x10E6/uL (ref 3.77–5.28)
RDW: 13.2 % (ref 11.7–15.4)
WBC: 10.4 10*3/uL (ref 3.4–10.8)

## 2018-06-20 LAB — COMPREHENSIVE METABOLIC PANEL
ALT: 8 IU/L (ref 0–32)
AST: 10 IU/L (ref 0–40)
Albumin/Globulin Ratio: 1.4 (ref 1.2–2.2)
Albumin: 3.6 g/dL — ABNORMAL LOW (ref 3.8–4.8)
Alkaline Phosphatase: 113 IU/L (ref 39–117)
BUN / CREAT RATIO: 10 (ref 9–23)
BUN: 4 mg/dL — ABNORMAL LOW (ref 6–20)
Bilirubin Total: <0.2 mg/dL (ref 0.0–1.2)
CO2: 21 mmol/L (ref 20–29)
Calcium: 9.3 mg/dL (ref 8.7–10.2)
Chloride: 104 mmol/L (ref 96–106)
Creatinine, Ser: 0.41 mg/dL — ABNORMAL LOW (ref 0.57–1.00)
GFR calc non Af Amer: 135 mL/min/{1.73_m2} (ref >59)
GFR, EST AFRICAN AMERICAN: 156 mL/min/{1.73_m2} (ref >59)
Globulin, Total: 2.6 g/dL (ref 1.5–4.5)
Glucose: 83 mg/dL (ref 65–99)
Potassium: 3.2 mmol/L — ABNORMAL LOW (ref 3.5–5.2)
Sodium: 141 mmol/L (ref 134–144)
Total Protein: 6.2 g/dL (ref 6.0–8.5)

## 2018-06-20 LAB — PROTEIN / CREATININE RATIO, URINE
Creatinine, Urine: 30 mg/dL
Protein, Ur: 5.6 mg/dL
Protein/Creat Ratio: 187 mg/g creat (ref 0–200)

## 2018-06-26 ENCOUNTER — Other Ambulatory Visit: Payer: Medicaid Other

## 2018-06-26 ENCOUNTER — Inpatient Hospital Stay (HOSPITAL_COMMUNITY)
Admission: AD | Admit: 2018-06-26 | Discharge: 2018-06-26 | Disposition: A | Discharge status: Home / Self Care | Payer: Medicaid Other | Source: Ambulatory Visit | Attending: Obstetrics and Gynecology | Admitting: Obstetrics and Gynecology

## 2018-06-26 ENCOUNTER — Ambulatory Visit (HOSPITAL_COMMUNITY)
Admission: RE | Admit: 2018-06-26 | Discharge: 2018-06-26 | Disposition: A | Discharge status: Home / Self Care | Payer: Medicaid Other | Source: Ambulatory Visit | Attending: Maternal & Fetal Medicine | Admitting: Maternal & Fetal Medicine

## 2018-06-26 ENCOUNTER — Other Ambulatory Visit: Payer: Self-pay

## 2018-06-26 ENCOUNTER — Encounter (HOSPITAL_COMMUNITY): Payer: Self-pay | Admitting: *Deleted

## 2018-06-26 ENCOUNTER — Encounter: Payer: Medicaid Other | Admitting: Obstetrics & Gynecology

## 2018-06-26 ENCOUNTER — Encounter (HOSPITAL_COMMUNITY): Payer: Self-pay

## 2018-06-26 DIAGNOSIS — Z3A34 34 weeks gestation of pregnancy: Secondary | ICD-10-CM

## 2018-06-26 DIAGNOSIS — O133 Gestational [pregnancy-induced] hypertension without significant proteinuria, third trimester: Secondary | ICD-10-CM | POA: Insufficient documentation

## 2018-06-26 DIAGNOSIS — Z362 Encounter for other antenatal screening follow-up: Secondary | ICD-10-CM

## 2018-06-26 DIAGNOSIS — Z3689 Encounter for other specified antenatal screening: Secondary | ICD-10-CM

## 2018-06-26 DIAGNOSIS — R030 Elevated blood-pressure reading, without diagnosis of hypertension: Secondary | ICD-10-CM | POA: Diagnosis present

## 2018-06-26 DIAGNOSIS — O0990 Supervision of high risk pregnancy, unspecified, unspecified trimester: Secondary | ICD-10-CM | POA: Insufficient documentation

## 2018-06-26 LAB — COMPREHENSIVE METABOLIC PANEL
ALT: 14 U/L (ref 0–44)
AST: 19 U/L (ref 15–41)
Albumin: 3.3 g/dL — ABNORMAL LOW (ref 3.5–5.0)
Alkaline Phosphatase: 107 U/L (ref 38–126)
Anion gap: 10 (ref 5–15)
BUN: 6 mg/dL (ref 6–20)
CO2: 22 mmol/L (ref 22–32)
Calcium: 9.5 mg/dL (ref 8.9–10.3)
Chloride: 105 mmol/L (ref 98–111)
Creatinine, Ser: 0.33 mg/dL — ABNORMAL LOW (ref 0.44–1.00)
GFR calc Af Amer: >60 mL/min (ref >60)
GFR calc non Af Amer: >60 mL/min (ref >60)
Glucose, Bld: 102 mg/dL — ABNORMAL HIGH (ref 70–99)
Potassium: 3.1 mmol/L — ABNORMAL LOW (ref 3.5–5.1)
Sodium: 137 mmol/L (ref 135–145)
Total Bilirubin: 0.5 mg/dL (ref 0.3–1.2)
Total Protein: 7.3 g/dL (ref 6.5–8.1)

## 2018-06-26 LAB — CBC
HCT: 38.9 % (ref 36.0–46.0)
Hemoglobin: 13.5 g/dL (ref 12.0–15.0)
MCH: 31.9 pg (ref 26.0–34.0)
MCHC: 34.7 g/dL (ref 30.0–36.0)
MCV: 92 fL (ref 80.0–100.0)
Platelets: 170 10*3/uL (ref 150–400)
RBC: 4.23 MIL/uL (ref 3.87–5.11)
RDW: 13.8 % (ref 11.5–15.5)
WBC: 9.1 10*3/uL (ref 4.0–10.5)
nRBC: 0 % (ref 0.0–0.2)

## 2018-06-26 LAB — PROTEIN / CREATININE RATIO, URINE
Creatinine, Urine: 24 mg/dL
Total Protein, Urine: <6 mg/dL

## 2018-06-26 NOTE — MAU Provider Note (Signed)
History     CSN: [id number]  Arrival date and time: 06/26/18 [id number]   First Provider Initiated Contact with Patient 06/26/18 1027      Chief Complaint  Patient presents with  . Hypertension  . Tachycardia   G1 @34 .2 wks sent from MFM for elevated BP. Denies HA, visual disturbances, RUQ pain, SOB, and CP. Reports good FM. No VB, LOF, or ctx. Dx with gHTN around 28 wks.  OB History    Gravida  1   Para  0   Term  0   Preterm  0   AB  0   Living  0     SAB  0   TAB  0   Ectopic  0   Multiple  0   Live Births  0           Past Medical History:  Diagnosis Date  . Medical history non-contributory     Past Surgical History:  Procedure Laterality Date  . NO PAST SURGERIES      Family History  Problem Relation Age of Onset  . Hypertension Mother     Social History   Tobacco Use  . Smoking status: Never Smoker  . Smokeless tobacco: Never Used  Substance Use Topics  . Alcohol use: Not Currently    Frequency: Never  . Drug use: Never    Allergies: No Known Allergies  Medications Prior to Admission  Medication Sig Dispense Refill Last Dose  . Prenatal Vit-Fe Fumarate-FA (PRENATAL VITAMIN PO) Take 1 tablet by mouth daily.    Taking    Review of Systems  Eyes: Negative for visual disturbance.  Respiratory: Negative for shortness of breath.   Cardiovascular: Negative for leg swelling.  Gastrointestinal: Negative for abdominal pain.  Genitourinary: Negative for vaginal bleeding and vaginal discharge.  Neurological: Negative for headaches.   Physical Exam   Blood pressure 139/89, pulse (!) 119, temperature 98.6 F (37 C), temperature source Oral, resp. rate 16, last menstrual period 10/29/2017, SpO2 98 %.  Physical Exam  Nursing note and vitals reviewed. Constitutional: She is oriented to person, place, and time. She appears well-developed and well-nourished. No distress.  HENT:  Head: Normocephalic and atraumatic.  Neck: Normal range of  motion.  Respiratory: Effort normal. No respiratory distress.  Musculoskeletal: Normal range of motion.  Neurological: She is alert and oriented to person, place, and time.  Psychiatric: She has a normal mood and affect.  EFM: 150 bpm, mod variability, + accels, rare variable decels Toco: rare  Results for orders placed or performed during the hospital encounter of 06/26/18 (from the past 24 hour(s))  CBC     Status: None   Collection Time: 06/26/18 10:15 AM  Result Value Ref Range   WBC 9.1 4.0 - 10.5 K/uL   RBC 4.23 3.87 - 5.11 MIL/uL   Hemoglobin 13.5 12.0 - 15.0 g/dL   HCT 56.238.9 13.036.0 - 86.546.0 %   MCV 92.0 80.0 - 100.0 fL   MCH 31.9 26.0 - 34.0 pg   MCHC 34.7 30.0 - 36.0 g/dL   RDW 78.413.8 69.611.5 - 29.515.5 %   Platelets 170 150 - 400 K/uL   nRBC 0.0 0.0 - 0.2 %   MAU Course  Procedures Orders Placed This Encounter  Procedures  . CBC    Standing Status:   Standing    Number of Occurrences:   1  . Comprehensive metabolic panel    Standing Status:   Standing    Number of  Occurrences:   1  . Protein / creatinine ratio, urine    Standing Status:   Standing    Number of Occurrences:   1   MDM Labs ordered and reviewed. No evidence of PEC. Stable for discharge home. Needs twice weekly AT and reschedule missed appt- message sent.  Assessment and Plan   1. [redacted] weeks gestation of pregnancy   2. Gestational hypertension, third trimester   3. NST (non-stress test) reactive    Discharge home Follow up in [facility] PEC precautions  Allergies as of 06/26/2018   No Known Allergies     Medication List    TAKE these medications   PRENATAL VITAMIN PO Take 1 tablet by mouth daily.      Donette Larry, CNM 06/26/2018, 10:29 AM

## 2018-06-26 NOTE — ED Notes (Signed)
Report called to [facility], RN in MAU.  Pt to MAU for evaluation of elevated BP and pulse.

## 2018-06-26 NOTE — MAU Note (Signed)
Pt sent from MFM for elevated BP & heart rate.  Has had high BP "for a while" now.  Denies HA or blurred vision.  Denies contractions, bleeding or LOF.  Reports good fetal movement.

## 2018-06-26 NOTE — Discharge Instructions (Signed)
Hypertension During Pregnancy ° °Hypertension is also called high blood pressure. High blood pressure means that the force of your blood moving in your body is too strong. When you are pregnant, this condition should be watched carefully. It can cause problems for you and your baby. °Follow these instructions at home: °Eating and drinking ° °· Drink enough fluid to keep your pee (urine) pale yellow. °· Avoid caffeine. °Lifestyle °· Do not use any products that contain nicotine or tobacco, such as cigarettes and e-cigarettes. If you need help quitting, ask your doctor. °· Do not use alcohol or drugs. °· Avoid stress. °· Rest and get plenty of sleep. °General instructions °· Take over-the-counter and prescription medicines only as told by your doctor. °· While lying down, lie on your left side. This keeps pressure off your major blood vessels. °· While sitting or lying down, raise (elevate) your feet. Try putting some pillows under your lower legs. °· Exercise regularly. Ask your doctor what kinds of exercise are best for you. °· Keep all prenatal and follow-up visits as told by your doctor. This is important. °Contact a doctor if: °· You have symptoms that your doctor told you to watch for, such as: °? Throwing up (vomiting). °? Feeling sick to your stomach (nausea). °? Headache. °Get help right away if you have: °· Very bad belly pain that does not get better with treatment. °· A very bad headache that does not get better. °· Throwing up that does not get better with treatment. °· Sudden, fast weight gain. °· Sudden swelling in your hands, ankles, or face. °· Bleeding from your vagina. °· Blood in your pee. °· Fewer movements from your baby than usual. °· Blurry vision. °· Double vision. °· Muscle twitching. °· Sudden muscle tightening (spasms). °· Trouble breathing. °· Blue fingernails or lips. °Summary °· Hypertension is also called high blood pressure. High blood pressure means that the force of your blood moving  in your body is too strong. °· When you are pregnant, this condition should be watched carefully. It can cause problems for you and your baby. °· Get help right away if you have symptoms that your doctor told you to watch for. °This information is not intended to replace advice given to you by your health care provider. Make sure you discuss any questions you have with your health care provider. °Document Released: 06/17/2010 Document Revised: 05/01/2017 Document Reviewed: 01/25/2016 °Elsevier Interactive Patient Education © 2019 [employer] ° °

## 2018-06-28 ENCOUNTER — Ambulatory Visit (HOSPITAL_COMMUNITY): Payer: Medicaid Other

## 2018-07-03 ENCOUNTER — Ambulatory Visit (INDEPENDENT_AMBULATORY_CARE_PROVIDER_SITE_OTHER): Payer: Medicaid Other | Admitting: Obstetrics & Gynecology

## 2018-07-03 ENCOUNTER — Ambulatory Visit (INDEPENDENT_AMBULATORY_CARE_PROVIDER_SITE_OTHER): Payer: Medicaid Other | Admitting: *Deleted

## 2018-07-03 ENCOUNTER — Ambulatory Visit: Payer: Self-pay

## 2018-07-03 ENCOUNTER — Other Ambulatory Visit (HOSPITAL_COMMUNITY)
Admission: RE | Admit: 2018-07-03 | Discharge: 2018-07-03 | Disposition: A | Discharge status: Home / Self Care | Payer: Medicaid Other | Source: Ambulatory Visit | Attending: Obstetrics & Gynecology | Admitting: Obstetrics & Gynecology

## 2018-07-03 VITALS — BP 134/94 | HR 140 | Wt 117.0 lb

## 2018-07-03 DIAGNOSIS — O0990 Supervision of high risk pregnancy, unspecified, unspecified trimester: Secondary | ICD-10-CM | POA: Insufficient documentation

## 2018-07-03 DIAGNOSIS — O133 Gestational [pregnancy-induced] hypertension without significant proteinuria, third trimester: Secondary | ICD-10-CM

## 2018-07-03 DIAGNOSIS — O0993 Supervision of high risk pregnancy, unspecified, third trimester: Secondary | ICD-10-CM

## 2018-07-03 DIAGNOSIS — O99810 Abnormal glucose complicating pregnancy: Secondary | ICD-10-CM

## 2018-07-03 DIAGNOSIS — Z3A35 35 weeks gestation of pregnancy: Secondary | ICD-10-CM | POA: Diagnosis not present

## 2018-07-03 DIAGNOSIS — Z789 Other specified health status: Secondary | ICD-10-CM

## 2018-07-03 LAB — POCT URINALYSIS DIP (DEVICE)
Bilirubin Urine: NEGATIVE
Glucose, UA: NEGATIVE mg/dL
Hgb urine dipstick: NEGATIVE
KETONES UR: NEGATIVE mg/dL
Leukocytes, UA: NEGATIVE
Nitrite: NEGATIVE
PROTEIN: NEGATIVE mg/dL
SPECIFIC GRAVITY, URINE: 1.01 (ref 1.005–1.030)
Urobilinogen, UA: 0.2 mg/dL (ref 0.0–1.0)
pH: 7 (ref 5.0–8.0)

## 2018-07-03 LAB — OB RESULTS CONSOLE GBS: GBS: NEGATIVE

## 2018-07-03 NOTE — Progress Notes (Signed)
   PRENATAL VISIT NOTE  Subjective:  Crystal Powers is a 33 y.o. G1P0000 at [redacted]w[redacted]d being seen today for ongoing prenatal care.  She is currently monitored for the following issues for this low-risk pregnancy and has Supervision of high risk pregnancy, antepartum; Gestational hypertension; Left pyelectasis of fetus on prenatal ultrasound; Abnormal glucose affecting pregnancy; and Language barrier on their problem list.  Patient reports no complaints.  Contractions: Not present. Vag. Bleeding: None.  Movement: Present. Denies leaking of fluid.   The following portions of the patient's history were reviewed and updated as appropriate: allergies, current medications, past family history, past medical history, past social history, past surgical history and problem list. Problem list updated.  Objective:   Vitals:   07/03/18 1324 07/03/18 1403  BP: (!) 150/91 (!) 134/94  Pulse: (!) 161 (!) 140  Weight: 117 lb (53.1 kg)     Fetal Status: Fetal Heart Rate (bpm): NST   Movement: Present     General:  Alert, oriented and cooperative. Patient is in no acute distress.  Skin: Skin is warm and dry. No rash noted.   Cardiovascular: Normal heart rate noted  Respiratory: Normal respiratory effort, no problems with respiration noted  Abdomen: Soft, gravid, appropriate for gestational age.  Pain/Pressure: Absent     Pelvic: Cervical exam performed        Extremities: Normal range of motion.     Mental Status: Normal mood and affect. Normal behavior. Normal judgment and thought content.   Assessment and Plan:  Pregnancy: G1P0000 at [redacted]w[redacted]d  1. Supervision of high risk pregnancy, antepartum  - Culture, beta strep (group b only) - GC/Chlamydia probe amp ()not at [facility]  2. Gestational hypertension, third trimester - she denies any symptoms - CBC - Comprehensive metabolic panel - Protein / creatinine ratio, urine - IOL for 37 weeks  3. Language barrier - Live interpretor present  4.  Abnormal glucose affecting pregnancy - normal 3 hour GTT  Preterm labor symptoms and general obstetric precautions including but not limited to vaginal bleeding, contractions, leaking of fluid and fetal movement were reviewed in detail with the patient. Please refer to After Visit Summary for other counseling recommendations.  Return in about 1 week (around 07/10/2018) for [facility] and NST/BPP.  Future Appointments  Date Time Provider Department Center  07/15/2018  6:30 AM WH-BSSCHED ROOM WH-BSSCHED None    Allie Bossier, MD

## 2018-07-03 NOTE — Progress Notes (Signed)
Interpreter Lanna PocheBhumika Gautam present for encounter. Pt states she is slightly aware of fast HR but not bothersome. She denies SOB, H/A, RUQ pain or visual disturbances . IOL scheduled on 2/17@ 0630.   Pt informed that the ultrasound is considered a limited OB ultrasound and is not intended to be a complete ultrasound exam.  Patient also informed that the ultrasound is not being completed with the intent of assessing for fetal or placental anomalies or any pelvic abnormalities.  Explained that the purpose of today's ultrasound is to assess for presentation, BPP and amniotic fluid volume.  Patient acknowledges the purpose of the exam and the limitations of the study.

## 2018-07-03 NOTE — Progress Notes (Signed)
I have reviewed this chart and agree with the RN/CMA assessment and management.    Kira Hartl C Yajayra Feldt, MD, FACOG Attending Physician, Faculty Practice [facility]  

## 2018-07-04 LAB — COMPREHENSIVE METABOLIC PANEL
A/G RATIO: 1.4 (ref 1.2–2.2)
ALBUMIN: 3.7 g/dL — AB (ref 3.8–4.8)
ALT: 11 IU/L (ref 0–32)
AST: 17 IU/L (ref 0–40)
Alkaline Phosphatase: 128 IU/L — ABNORMAL HIGH (ref 39–117)
BUN/Creatinine Ratio: 14 (ref 9–23)
BUN: 6 mg/dL (ref 6–20)
Bilirubin Total: 0.3 mg/dL (ref 0.0–1.2)
CO2: 20 mmol/L (ref 20–29)
Calcium: 9.4 mg/dL (ref 8.7–10.2)
Chloride: 97 mmol/L (ref 96–106)
Creatinine, Ser: 0.42 mg/dL — ABNORMAL LOW (ref 0.57–1.00)
GFR calc Af Amer: 155 mL/min/{1.73_m2} (ref >59)
GFR calc non Af Amer: 134 mL/min/{1.73_m2} (ref >59)
GLOBULIN, TOTAL: 2.7 g/dL (ref 1.5–4.5)
Glucose: 97 mg/dL (ref 65–99)
POTASSIUM: 3.4 mmol/L — AB (ref 3.5–5.2)
Sodium: 141 mmol/L (ref 134–144)
Total Protein: 6.4 g/dL (ref 6.0–8.5)

## 2018-07-04 LAB — CBC
Hematocrit: 37.4 % (ref 34.0–46.6)
Hemoglobin: 13.5 g/dL (ref 11.1–15.9)
MCH: 32.4 pg (ref 26.6–33.0)
MCHC: 36.1 g/dL — AB (ref 31.5–35.7)
MCV: 90 fL (ref 79–97)
Platelets: 215 10*3/uL (ref 150–450)
RBC: 4.17 x10E6/uL (ref 3.77–5.28)
RDW: 12.9 % (ref 11.7–15.4)
WBC: 9.7 10*3/uL (ref 3.4–10.8)

## 2018-07-04 LAB — GC/CHLAMYDIA PROBE AMP (~~LOC~~) NOT AT ARMC
Chlamydia: NEGATIVE
Neisseria Gonorrhea: NEGATIVE

## 2018-07-07 LAB — PROTEIN / CREATININE RATIO, URINE
Creatinine, Urine: 21.2 mg/dL
Protein, Ur: <4 mg/dL

## 2018-07-07 LAB — CULTURE, BETA STREP (GROUP B ONLY): Strep Gp B Culture: NEGATIVE

## 2018-07-08 ENCOUNTER — Other Ambulatory Visit: Payer: Self-pay | Admitting: Family Medicine

## 2018-07-09 ENCOUNTER — Telehealth (HOSPITAL_COMMUNITY): Payer: Self-pay | Admitting: *Deleted

## 2018-07-09 NOTE — Telephone Encounter (Signed)
Preadmission screen Interpreter number [phone]

## 2018-07-11 ENCOUNTER — Ambulatory Visit: Payer: Self-pay

## 2018-07-11 ENCOUNTER — Ambulatory Visit (INDEPENDENT_AMBULATORY_CARE_PROVIDER_SITE_OTHER): Payer: Medicaid Other | Admitting: *Deleted

## 2018-07-11 ENCOUNTER — Encounter: Payer: Self-pay | Admitting: Obstetrics and Gynecology

## 2018-07-11 ENCOUNTER — Ambulatory Visit (INDEPENDENT_AMBULATORY_CARE_PROVIDER_SITE_OTHER): Payer: Medicaid Other | Admitting: Obstetrics and Gynecology

## 2018-07-11 VITALS — BP 144/86 | HR 142 | Wt 119.1 lb

## 2018-07-11 DIAGNOSIS — Z3A36 36 weeks gestation of pregnancy: Secondary | ICD-10-CM | POA: Diagnosis not present

## 2018-07-11 DIAGNOSIS — O133 Gestational [pregnancy-induced] hypertension without significant proteinuria, third trimester: Secondary | ICD-10-CM

## 2018-07-11 DIAGNOSIS — O0990 Supervision of high risk pregnancy, unspecified, unspecified trimester: Secondary | ICD-10-CM

## 2018-07-11 DIAGNOSIS — O0993 Supervision of high risk pregnancy, unspecified, third trimester: Secondary | ICD-10-CM

## 2018-07-11 LAB — POCT URINALYSIS DIP (DEVICE)
Bilirubin Urine: NEGATIVE
Glucose, UA: NEGATIVE mg/dL
Hgb urine dipstick: NEGATIVE
Ketones, ur: NEGATIVE mg/dL
Leukocytes,Ua: NEGATIVE
Nitrite: NEGATIVE
Protein, ur: NEGATIVE mg/dL
Specific Gravity, Urine: 1.015 (ref 1.005–1.030)
Urobilinogen, UA: 0.2 mg/dL (ref 0.0–1.0)
pH: 7 (ref 5.0–8.0)

## 2018-07-11 NOTE — Progress Notes (Signed)
Subjective:  Crystal Powers is a 33 y.o. G1P0000 at [redacted]w[redacted]d being seen today for ongoing prenatal care.  She is currently monitored for the following issues for this high-risk pregnancy and has Supervision of high risk pregnancy, antepartum; Gestational hypertension; Abnormal glucose affecting pregnancy; and Language barrier on their problem list.  Patient reports no complaints.  Contractions: Not present. Vag. Bleeding: None.  Movement: Present. Denies leaking of fluid.   The following portions of the patient's history were reviewed and updated as appropriate: allergies, current medications, past family history, past medical history, past social history, past surgical history and problem list. Problem list updated.  Objective:   Vitals:   07/11/18 0905  BP: (!) 144/86  Pulse: (!) 142  Weight: 119 lb 1.6 oz (54 kg)    Fetal Status: Fetal Heart Rate (bpm): NST   Movement: Present     General:  Alert, oriented and cooperative. Patient is in no acute distress.  Skin: Skin is warm and dry. No rash noted.   Cardiovascular: Normal heart rate noted  Respiratory: Normal respiratory effort, no problems with respiration noted  Abdomen: Soft, gravid, appropriate for gestational age. Pain/Pressure: Absent     Pelvic:  Cervical exam deferred        Extremities: Normal range of motion.     Mental Status: Normal mood and affect. Normal behavior. Normal judgment and thought content.   Urinalysis:      Assessment and Plan:  Pregnancy: G1P0000 at [redacted]w[redacted]d  1. Supervision of high risk pregnancy, antepartum Stable  2. Gestational hypertension, third trimester BP stable BPP 10/10 today IOL on Monday  Term labor symptoms and general obstetric precautions including but not limited to vaginal bleeding, contractions, leaking of fluid and fetal movement were reviewed in detail with the patient. Please refer to After Visit Summary for other counseling recommendations.  Return in about 5 weeks (around  08/15/2018) for PP visit.  IOL on 2/17.   Hermina Staggers, MD

## 2018-07-11 NOTE — Progress Notes (Signed)
Pt is scheduled for IOL on 2/17 @ 0630.   Pt informed that the ultrasound is considered a limited OB ultrasound and is not intended to be a complete ultrasound exam.  Patient also informed that the ultrasound is not being completed with the intent of assessing for fetal or placental anomalies or any pelvic abnormalities.  Explained that the purpose of today's ultrasound is to assess for presentation, BPP and amniotic fluid volume.  Patient acknowledges the purpose of the exam and the limitations of the study.

## 2018-07-15 ENCOUNTER — Inpatient Hospital Stay (HOSPITAL_COMMUNITY)
Admission: RE | Admit: 2018-07-15 | Discharge: 2018-07-19 | DRG: 768 | Disposition: A | Discharge status: Home / Self Care | Payer: Medicaid Other | Attending: Family Medicine | Admitting: Family Medicine

## 2018-07-15 ENCOUNTER — Encounter (HOSPITAL_COMMUNITY): Payer: Self-pay

## 2018-07-15 DIAGNOSIS — Z3A37 37 weeks gestation of pregnancy: Secondary | ICD-10-CM

## 2018-07-15 DIAGNOSIS — O134 Gestational [pregnancy-induced] hypertension without significant proteinuria, complicating childbirth: Secondary | ICD-10-CM | POA: Diagnosis present

## 2018-07-15 DIAGNOSIS — O99814 Abnormal glucose complicating childbirth: Secondary | ICD-10-CM | POA: Diagnosis present

## 2018-07-15 DIAGNOSIS — Z3490 Encounter for supervision of normal pregnancy, unspecified, unspecified trimester: Secondary | ICD-10-CM | POA: Diagnosis present

## 2018-07-15 DIAGNOSIS — O139 Gestational [pregnancy-induced] hypertension without significant proteinuria, unspecified trimester: Secondary | ICD-10-CM | POA: Diagnosis present

## 2018-07-15 DIAGNOSIS — O99810 Abnormal glucose complicating pregnancy: Secondary | ICD-10-CM | POA: Diagnosis present

## 2018-07-15 LAB — CBC
HCT: 39.2 % (ref 36.0–46.0)
Hemoglobin: 13.6 g/dL (ref 12.0–15.0)
MCH: 31.9 pg (ref 26.0–34.0)
MCHC: 34.7 g/dL (ref 30.0–36.0)
MCV: 92 fL (ref 80.0–100.0)
Platelets: 209 10*3/uL (ref 150–400)
RBC: 4.26 MIL/uL (ref 3.87–5.11)
RDW: 14.1 % (ref 11.5–15.5)
WBC: 9.1 10*3/uL (ref 4.0–10.5)
nRBC: 0 % (ref 0.0–0.2)

## 2018-07-15 LAB — COMPREHENSIVE METABOLIC PANEL
ALT: 14 U/L (ref 0–44)
AST: 23 U/L (ref 15–41)
Albumin: 3.1 g/dL — ABNORMAL LOW (ref 3.5–5.0)
Alkaline Phosphatase: 131 U/L — ABNORMAL HIGH (ref 38–126)
Anion gap: 11 (ref 5–15)
BUN: 7 mg/dL (ref 6–20)
CO2: 18 mmol/L — ABNORMAL LOW (ref 22–32)
Calcium: 8.7 mg/dL — ABNORMAL LOW (ref 8.9–10.3)
Chloride: 106 mmol/L (ref 98–111)
Creatinine, Ser: 0.4 mg/dL — ABNORMAL LOW (ref 0.44–1.00)
GFR calc Af Amer: >60 mL/min (ref >60)
GFR calc non Af Amer: >60 mL/min (ref >60)
Glucose, Bld: 130 mg/dL — ABNORMAL HIGH (ref 70–99)
Potassium: 2.8 mmol/L — ABNORMAL LOW (ref 3.5–5.1)
Sodium: 135 mmol/L (ref 135–145)
Total Bilirubin: 0.5 mg/dL (ref 0.3–1.2)
Total Protein: 6.3 g/dL — ABNORMAL LOW (ref 6.5–8.1)

## 2018-07-15 LAB — TYPE AND SCREEN
ABO/RH(D): O POS
Antibody Screen: NEGATIVE

## 2018-07-15 LAB — PROTEIN / CREATININE RATIO, URINE
Creatinine, Urine: 34 mg/dL
Total Protein, Urine: <6 mg/dL

## 2018-07-15 LAB — RPR: RPR Ser Ql: NONREACTIVE

## 2018-07-15 MED ORDER — FENTANYL CITRATE (PF) 100 MCG/2ML IJ SOLN
50.0000 ug | INTRAMUSCULAR | Status: DC | PRN
  Administered 2018-07-15: 50 ug via INTRAVENOUS
  Filled 2018-07-15: qty 2

## 2018-07-15 MED ORDER — FLEET ENEMA 7-19 GM/118ML RE ENEM: 1.0000 | ENEMA | RECTAL | Status: DC | PRN

## 2018-07-15 MED ORDER — ONDANSETRON HCL 4 MG/2ML IJ SOLN: 4.0000 mg | Freq: Four times a day (QID) | INTRAMUSCULAR | Status: DC | PRN

## 2018-07-15 MED ORDER — FENTANYL CITRATE (PF) 100 MCG/2ML IJ SOLN: 100.0000 ug | INTRAMUSCULAR | Status: DC | PRN

## 2018-07-15 MED ORDER — OXYTOCIN 40 UNITS IN NORMAL SALINE INFUSION - SIMPLE MED
2.5000 [IU]/h | INTRAVENOUS | Status: DC
  Filled 2018-07-15: qty 1000

## 2018-07-15 MED ORDER — MISOPROSTOL 25 MCG QUARTER TABLET
25.0000 ug | ORAL_TABLET | ORAL | Status: DC | PRN
  Administered 2018-07-15: 25 ug via VAGINAL
  Filled 2018-07-15: qty 1

## 2018-07-15 MED ORDER — LIDOCAINE HCL (PF) 1 % IJ SOLN
30.0000 mL | INTRAMUSCULAR | Status: DC | PRN
  Filled 2018-07-15: qty 30

## 2018-07-15 MED ORDER — MISOPROSTOL 50MCG HALF TABLET
50.0000 ug | ORAL_TABLET | ORAL | Status: DC | PRN
  Administered 2018-07-15 (×3): 50 ug via BUCCAL
  Filled 2018-07-15 (×3): qty 1

## 2018-07-15 MED ORDER — POTASSIUM CHLORIDE CRYS ER 20 MEQ PO TBCR
40.0000 meq | EXTENDED_RELEASE_TABLET | Freq: Two times a day (BID) | ORAL | Status: DC
  Administered 2018-07-15 (×2): 40 meq via ORAL
  Filled 2018-07-15 (×3): qty 2

## 2018-07-15 MED ORDER — TERBUTALINE SULFATE 1 MG/ML IJ SOLN
0.2500 mg | Freq: Once | INTRAMUSCULAR | Status: DC | PRN
  Filled 2018-07-15: qty 1

## 2018-07-15 MED ORDER — LACTATED RINGERS IV SOLN
500.0000 mL | INTRAVENOUS | Status: DC | PRN
  Administered 2018-07-16: 1000 mL via INTRAVENOUS
  Administered 2018-07-16 – 2018-07-17 (×2): 500 mL via INTRAVENOUS
  Administered 2018-07-17: 1000 mL via INTRAVENOUS

## 2018-07-15 MED ORDER — OXYTOCIN BOLUS FROM INFUSION
500.0000 mL | Freq: Once | INTRAVENOUS | Status: AC
  Administered 2018-07-17: 500 mL via INTRAVENOUS

## 2018-07-15 MED ORDER — FENTANYL CITRATE (PF) 100 MCG/2ML IJ SOLN
50.0000 ug | INTRAMUSCULAR | Status: DC | PRN
  Administered 2018-07-15 (×2): 100 ug via INTRAVENOUS
  Filled 2018-07-15 (×3): qty 2

## 2018-07-15 MED ORDER — LACTATED RINGERS IV SOLN
INTRAVENOUS | Status: DC
  Administered 2018-07-15 – 2018-07-17 (×9): via INTRAVENOUS

## 2018-07-15 MED ORDER — SOD CITRATE-CITRIC ACID 500-334 MG/5ML PO SOLN: 30.0000 mL | ORAL | Status: DC | PRN

## 2018-07-15 NOTE — H&P (Signed)
LABOR AND DELIVERY ADMISSION HISTORY AND PHYSICAL NOTE  Crystal Powers is a 33 y.o. female G1P0000 with IUP at [redacted]w[redacted]d by [place] presenting for IOL for GHTN. She reports positive fetal movement. She denies leakage of fluid or vaginal bleeding.  Prenatal History/Complications: PNC at [facility] -> [facility] Pregnancy complications:  - Gestational hypertension - Abnormal glucose affecting pregnancy  - Language barrier   Past Medical History: Past Medical History:  Diagnosis Date  . Medical history non-contributory     Past Surgical History: Past Surgical History:  Procedure Laterality Date  . NO PAST SURGERIES      Obstetrical History: OB History    Gravida  1   Para  0   Term  0   Preterm  0   AB  0   Living  0     SAB  0   TAB  0   Ectopic  0   Multiple  0   Live Births  0           Social History: Social History   Socioeconomic History  . Marital status: Married    Spouse name: Not on file  . Number of children: Not on file  . Years of education: Not on file  . Highest education level: Not on file  Occupational History  . Not on file  Social Needs  . Financial resource strain: Not on file  . Food insecurity:    Worry: Not on file    Inability: Not on file  . Transportation needs:    Medical: Not on file    Non-medical: Not on file  Tobacco Use  . Smoking status: Never Smoker  . Smokeless tobacco: Never Used  Substance and Sexual Activity  . Alcohol use: Not Currently    Frequency: Never  . Drug use: Never  . Sexual activity: Not on file  Lifestyle  . Physical activity:    Days per week: Not on file    Minutes per session: Not on file  . Stress: Not on file  Relationships  . Social connections:    Talks on phone: Not on file    Gets together: Not on file    Attends religious service: Not on file    Active member of club or organization: Not on file    Attends meetings of clubs or organizations: Not on file    Relationship status: Not on  file  Other Topics Concern  . Not on file  Social History Narrative  . Not on file    Family History: Family History  Problem Relation Age of Onset  . Hypertension Mother     Allergies: No Known Allergies  Medications Prior to Admission  Medication Sig Dispense Refill Last Dose  . Prenatal Vit-Fe Fumarate-FA (PRENATAL VITAMIN PO) Take 1 tablet by mouth daily.    Taking     Review of Systems  All systems reviewed and negative except as stated in HPI  Physical Exam Last menstrual period 10/29/2017. General appearance: alert, cooperative and no distress Lungs: clear to auscultation bilaterally Heart: regular rate and rhythm Abdomen: soft, non-tender; bowel sounds normal Extremities: No calf swelling or tenderness Presentation: cephalic Fetal monitoring: 145/ moderate/ +accels/ no decelerations  Uterine activity: occasional mild contractions     Prenatal labs: ABO, Rh: --/--/O POS, O POS Performed at [facility], [address] [place], [place] [address]  [phone]) Antibody: NEG (01/03 1530) Rubella: Immune (10/10 0000) RPR: Nonreactive (10/10 0000)  HBsAg: Negative (  10/10 0000)  HIV: Non-reactive (10/10 0000)  GC/Chlamydia: Negative  GBS:   Negative  1 hr Glucola: 130, normal 3 hour: 74-129-115-102 Genetic screening:  +quad w/ normal amnio (46XX) Anatomy US: normal   Nursing Staff Provider  Office Location [facility]  Dating   [facility] u/s  Language  Nepali Anatomy US   neg  Flu Vaccine  03/07/18 Genetic Screen  Quad: abnormal quad with normal amnio (46 xx)   TDaP vaccine   05/17/18 Hgb A1C or  GTT Early  Third trimester   Rhogam  n/a   LAB RESULTS   Feeding Plan Bottle Blood Type   O pos  Contraception undecided Antibody  neg  Circumcision n/a Rubella  imm  Pediatrician  Undecided-List given 06/03/18 RPR   neg  Support Person  HBsAg   neg  Prenatal Classes  HIV  neg  BTL Consent n/a GBS  (For PCN allergy, check sensitivities)   VBAC Consent n/a  Pap  neg 2019    Hgb Electro   neg    CF  neg    SMA    Prenatal Transfer Tool  Maternal Diabetes: No Genetic Screening: Abnormal:  Results: Normal amniocentesis, Other:pos quad Maternal Ultrasounds/Referrals: Normal Fetal Ultrasounds or other Referrals:  None Maternal Substance Abuse:  No Significant Maternal Medications:  None Significant Maternal Lab Results: Lab values include: Group B Strep negative  No results found for this or any previous visit (from the past 24 hour(s)).  Patient Active Problem List   Diagnosis Date Noted  . Abnormal glucose affecting pregnancy 06/03/2018  . Language barrier 06/03/2018  . Supervision of high risk pregnancy, antepartum 05/28/2018  . Gestational hypertension 05/28/2018    Assessment: Crystal Powers is a 33 y.o. G1P0000 at [redacted]w[redacted]d here for IOL for GHTN  #Labor: IOL with Cytotec #Pain: Pain medication ordered PRN  #FWB: Cat I  #ID:  GBS negative  #MOF: Formula  #MOC: unsure  #Circ:  n/a  Sharyon Cable, CNM 07/15/2018, 7:18 AM

## 2018-07-15 NOTE — Progress Notes (Signed)
Labor Progress Note Crystal Powers is a 33 y.o. G1P0000 at [redacted]w[redacted]d presented for IOL for gHTN  S:  Comfortable, no c/o.  O:  BP 113/83 (BP Location: Left Arm)   Pulse 98   Temp 98.4 F (36.9 C) (Oral)   Resp 18   Ht 5\' 2"  (1.575 m)   Wt 54.5 kg   LMP 10/29/2017 (Exact Date)   SpO2 99%   BMI 21.97 kg/m  EFM: baseline 145 bpm/ mod variability/ + accels/ none decels  Toco: 1-3 SVE: Dilation: 1 Effacement (%): Thick Cervical Position: Anterior Station: -2 Presentation: Vertex Exam by:: Donette Larry, CNM  A/P: 33 y.o. G1P0000 [redacted]w[redacted]d  1. Labor: latent 2. FWB: Cat I 3. Pain: analgesia prn 4. gHTN- stable  Foley placed. Continue Cytotec until foley out then Pitocin. Anticipate SVD.  Donette Larry, CNM 5:35 PM

## 2018-07-15 NOTE — Progress Notes (Addendum)
Crystal Powers is a 33 y.o. G1P0000 at [redacted]w[redacted]d by admitted for induction of labor due to Gestational Hypertension.  Subjective: Resting comfortably in bed, eyes closed with husband at bedise. No complaints or questions at this time.  Objective: BP 110/69   Pulse 89   Temp 98.4 F (36.9 C) (Oral)   Resp 17   Ht 5\' 2"  (1.575 m)   Wt 54.5 kg   LMP 10/29/2017 (Exact Date)   SpO2 99%   BMI 21.97 kg/m  No intake/output data recorded. No intake/output data recorded.   SVE:   Dilation: 1 Effacement (%): Thick Station: -2 Exam by:: Donette Larry, CNM  Assessment / Plan: Induction of labor for gHTN, foley bulb in place, recieved cytotec x3  Labor: Progressing normally Fetal Wellbeing:  Category I Pain Control:  Epidural gHTN: well controlled without need for medication Hypokalemia: Kdurr BID, recheck morning BMP  Arlyce Harman 07/15/2018, 9:14 PM

## 2018-07-15 NOTE — OB Triage Provider Note (Deleted)
Crystal Powers is a 32 y.o. G1P0000 at [redacted]w[redacted]d by admitted for induction of labor due to Gestational Hypertension.  Subjective: Resting comfortably in bed, eyes closed with husband at bedise. No complaints or questions at this time.  Objective: BP 110/69   Pulse 89   Temp 98.4 F (36.9 C) (Oral)   Resp 17   Ht 5' 2" (1.575 m)   Wt 54.5 kg   LMP 10/29/2017 (Exact Date)   SpO2 99%   BMI 21.97 kg/m  No intake/output data recorded. No intake/output data recorded.   SVE:   Dilation: 1 Effacement (%): Thick Station: -2 Exam by:: Melanie Bhambri, CNM  Assessment / Plan: Induction of labor for gHTN, foley bulb in place, recieved cytotec x3  Labor: Progressing normally Fetal Wellbeing:  Category I Pain Control:  Epidural gHTN: well controlled without need for medication Hypokalemia: 40mEq Kdurr BID, recheck morning BMP  Crystal Powers 07/15/2018, 9:14 PM 

## 2018-07-15 NOTE — Progress Notes (Signed)
LABOR PROGRESS NOTE  Crystal Powers is a 33 y.o. G1P0000 at [redacted]w[redacted]d  admitted for IOL for gHTN  Subjective: Comfortably, not currently with any pain.   Objective: BP 112/71   Pulse 94   Temp 98.4 F (36.9 C) (Oral)   Resp 18   Ht 5\' 2"  (1.575 m)   Wt 54.5 kg   LMP 10/29/2017 (Exact Date)   SpO2 99%   BMI 21.97 kg/m  or  Vitals:   07/15/18 0655 07/15/18 0715 07/15/18 0748 07/15/18 1058  BP:   129/84 112/71  Pulse:   (!) 107 94  Resp:   18 18  Temp:    98.4 F (36.9 C)  TempSrc:    Oral  SpO2:  99%    Weight: 54.5 kg     Height: 5\' 2"  (1.575 m)      Dilation: Fingertip Effacement (%): Thick Cervical Position: Anterior Station: -2 Presentation: Vertex Exam by:: Enis Slipper, RN, Orpah Cobb, DO FHT: baseline rate 140, moderate varibility, + acel, no decel Toco: 1-3 minutes apart  Labs: Lab Results  Component Value Date   WBC 9.1 07/15/2018   HGB 13.6 07/15/2018   HCT 39.2 07/15/2018   MCV 92.0 07/15/2018   PLT 209 07/15/2018    Patient Active Problem List   Diagnosis Date Noted  . Abnormal glucose affecting pregnancy 06/03/2018  . Language barrier 06/03/2018  . Supervision of high risk pregnancy, antepartum 05/28/2018  . Gestational hypertension 05/28/2018    Assessment / Plan: 33 y.o. G1P0000 at [redacted]w[redacted]d here for IOL for gHTN.  Labor: Latent Fetal Wellbeing:  CAT1 Pain Control:  PRN Anticipated MOD:  SVD BP: normotensive  Orpah Cobb, D.O. [facility], PGY1 07/15/2018, 12:55 PM

## 2018-07-15 NOTE — Progress Notes (Signed)
Labor Progress Note Crystal Powers is a 33 y.o. G1P0000 at [redacted]w[redacted]d presented for IOL for gHTN  S:  Comfortable, having some LBP. Denies HA, visual disturbances, RUQ pain, SOB, and CP. Reports good FM. No VB, LOF, ctx.  O:  BP 129/84   Pulse (!) 107   Temp 98.3 F (36.8 C) (Oral)   Resp 18   Ht 5\' 2"  (1.575 m)   Wt 54.5 kg   LMP 10/29/2017 (Exact Date)   SpO2 99%   BMI 21.97 kg/m  EFM: baseline 145 bpm/ mod variability/ + accels/ no decels  Toco: rare SVE: deferred Bedside US: cephalic  A/P: 33 y.o. G1P0000 [redacted]w[redacted]d  1. Labor: latent 2. FWB: Cat I  3. Pain: analgesia/anesthesia/NO prn 4. gHTN- stable, normal labs 5. Hypokalemia  S/p first dose Cytotec. Will place FB when able.  Replete K. Anticipate labor progress and SVD.  Donette Larry, CNM 10:13 AM

## 2018-07-15 NOTE — Anesthesia Pain Management Evaluation Note (Signed)
  CRNA Pain Management Visit Note  Patient: Crystal Powers, 33 y.o., female  "Hello I am a member of the anesthesia team at [facility]. We have an anesthesia team available at all times to provide care throughout the hospital, including epidural management and anesthesia for C-section. I don't know your plan for the delivery whether it a natural birth, water birth, IV sedation, nitrous supplementation, doula or epidural, but we want to meet your pain goals."   1.Was your pain managed to your expectations on prior hospitalizations?   No prior hospitalizations  2.What is your expectation for pain management during this hospitalization?     Epidural  3.How can we help you reach that goal? epidural  Record the patient's initial score and the patient's pain goal.   Pain: 0  Pain Goal: 5 [facility] wants you to be able to say your pain was always managed very well.  Buzz Axel 07/15/2018

## 2018-07-16 ENCOUNTER — Inpatient Hospital Stay (HOSPITAL_COMMUNITY): Payer: Medicaid Other | Admitting: Anesthesiology

## 2018-07-16 LAB — COMPREHENSIVE METABOLIC PANEL
ALK PHOS: 126 U/L (ref 38–126)
ALT: 13 U/L (ref 0–44)
AST: 20 U/L (ref 15–41)
Albumin: 2.9 g/dL — ABNORMAL LOW (ref 3.5–5.0)
Anion gap: 8 (ref 5–15)
BUN: <5 mg/dL — ABNORMAL LOW (ref 6–20)
CO2: 20 mmol/L — ABNORMAL LOW (ref 22–32)
Calcium: 8.4 mg/dL — ABNORMAL LOW (ref 8.9–10.3)
Chloride: 107 mmol/L (ref 98–111)
Creatinine, Ser: 0.39 mg/dL — ABNORMAL LOW (ref 0.44–1.00)
GFR calc Af Amer: >60 mL/min (ref >60)
Glucose, Bld: 85 mg/dL (ref 70–99)
Potassium: 3.3 mmol/L — ABNORMAL LOW (ref 3.5–5.1)
Sodium: 135 mmol/L (ref 135–145)
Total Bilirubin: 0.9 mg/dL (ref 0.3–1.2)
Total Protein: 6.5 g/dL (ref 6.5–8.1)

## 2018-07-16 LAB — CBC
HCT: 39.5 % (ref 36.0–46.0)
Hemoglobin: 13.7 g/dL (ref 12.0–15.0)
MCH: 31.9 pg (ref 26.0–34.0)
MCHC: 34.7 g/dL (ref 30.0–36.0)
MCV: 92.1 fL (ref 80.0–100.0)
Platelets: 170 10*3/uL (ref 150–400)
RBC: 4.29 MIL/uL (ref 3.87–5.11)
RDW: 14.2 % (ref 11.5–15.5)
WBC: 12.7 10*3/uL — ABNORMAL HIGH (ref 4.0–10.5)
nRBC: 0 % (ref 0.0–0.2)

## 2018-07-16 MED ORDER — FENTANYL 2.5 MCG/ML BUPIVACAINE 1/10 % EPIDURAL INFUSION (WH - ANES)
INTRAMUSCULAR | Status: DC | PRN
  Administered 2018-07-16: 14 mL/h via EPIDURAL

## 2018-07-16 MED ORDER — FENTANYL-BUPIVACAINE-NACL 0.5-0.125-0.9 MG/250ML-% EP SOLN
14.0000 mL/h | EPIDURAL | Status: DC | PRN
  Administered 2018-07-16 – 2018-07-17 (×2): 14 mL/h via EPIDURAL
  Filled 2018-07-16 (×3): qty 250

## 2018-07-16 MED ORDER — EPHEDRINE 5 MG/ML INJ
10.0000 mg | INTRAVENOUS | Status: DC | PRN
  Filled 2018-07-16: qty 2

## 2018-07-16 MED ORDER — POTASSIUM CHLORIDE CRYS ER 10 MEQ PO TBCR
10.0000 meq | EXTENDED_RELEASE_TABLET | Freq: Once | ORAL | Status: AC
  Administered 2018-07-16: 10 meq via ORAL
  Filled 2018-07-16: qty 1

## 2018-07-16 MED ORDER — LACTATED RINGERS IV SOLN: 500.0000 mL | Freq: Once | INTRAVENOUS | Status: DC

## 2018-07-16 MED ORDER — OXYTOCIN 40 UNITS IN NORMAL SALINE INFUSION - SIMPLE MED
1.0000 m[IU]/min | INTRAVENOUS | Status: DC
  Administered 2018-07-16: 2 m[IU]/min via INTRAVENOUS

## 2018-07-16 MED ORDER — PHENYLEPHRINE 40 MCG/ML (10ML) SYRINGE FOR IV PUSH (FOR BLOOD PRESSURE SUPPORT)
80.0000 ug | PREFILLED_SYRINGE | INTRAVENOUS | Status: DC | PRN
  Administered 2018-07-17 (×2): 80 ug via INTRAVENOUS
  Filled 2018-07-16: qty 10

## 2018-07-16 MED ORDER — BUPIVACAINE-EPINEPHRINE (PF) 0.25% -1:200000 IJ SOLN: INTRAMUSCULAR | Status: DC | PRN

## 2018-07-16 MED ORDER — BUPIVACAINE HCL (PF) 0.25 % IJ SOLN
INTRAMUSCULAR | Status: DC | PRN
  Administered 2018-07-16 (×2): 5 mL via EPIDURAL

## 2018-07-16 MED ORDER — MISOPROSTOL 25 MCG QUARTER TABLET
25.0000 ug | ORAL_TABLET | ORAL | Status: DC | PRN
  Administered 2018-07-16: 25 ug via VAGINAL
  Filled 2018-07-16 (×2): qty 1

## 2018-07-16 MED ORDER — POTASSIUM CHLORIDE CRYS ER 10 MEQ PO TBCR
10.0000 meq | EXTENDED_RELEASE_TABLET | Freq: Once | ORAL | Status: DC
  Filled 2018-07-16: qty 1

## 2018-07-16 MED ORDER — PHENYLEPHRINE 40 MCG/ML (10ML) SYRINGE FOR IV PUSH (FOR BLOOD PRESSURE SUPPORT)
80.0000 ug | PREFILLED_SYRINGE | INTRAVENOUS | Status: DC | PRN
  Filled 2018-07-16 (×2): qty 10

## 2018-07-16 MED ORDER — LIDOCAINE HCL (PF) 1 % IJ SOLN
INTRAMUSCULAR | Status: DC | PRN
  Administered 2018-07-16 (×2): 5 mL via EPIDURAL

## 2018-07-16 MED ORDER — FENTANYL CITRATE (PF) 100 MCG/2ML IJ SOLN
INTRAMUSCULAR | Status: DC | PRN
  Administered 2018-07-16 (×2): 50 ug via INTRAVENOUS

## 2018-07-16 MED ORDER — DIPHENHYDRAMINE HCL 50 MG/ML IJ SOLN: 12.5000 mg | INTRAMUSCULAR | Status: DC | PRN

## 2018-07-16 NOTE — Progress Notes (Signed)
YILIA SNOWDEN is a 33 y.o. G1P0000 at [redacted]w[redacted]d.  Subjective: Patient is asleep, resting comfortably in bed. Per husband who is at bedside she is doing well without concerns. I will let patient rest.  Objective: BP 124/69   Pulse 84   Temp 98.8 F (37.1 C) (Oral)   Resp 16   Ht 5\' 2"  (1.575 m)   Wt 54.5 kg   LMP 10/29/2017 (Exact Date)   SpO2 97%   BMI 21.97 kg/m     Dilation: 7 Effacement (%): 60, 70 Cervical Position: Anterior Station: -1 Presentation: Vertex Exam by:: Dr. Mauri Reading  Assessment / Plan: [redacted]w[redacted]d week IUP Labor: continue pitocin, progressing Fetal Wellbeing:  Cat 1 Pain Control:  Epidural in place Anticipated MOD:  SVD gHTN: cont to monitor BP per floor, no meds indicated at this time  Arlyce Harman, DO 07/16/2018 8:51 PM

## 2018-07-16 NOTE — Progress Notes (Signed)
LABOR PROGRESS NOTE  Crystal Powers is a 33 y.o. G1P0000 at [redacted]w[redacted]d  admitted for IOL for gHTN  Subjective: Lying comfortably sleeping upon entering for exam. Laying with peanut on right side. No complaints or concerns. No feeling any contractions.  Objective: BP 116/71 (BP Location: Left Arm)   Pulse 75   Temp 98.7 F (37.1 C)   Resp 16   Ht 5\' 2"  (1.575 m)   Wt 54.5 kg   LMP 10/29/2017 (Exact Date)   SpO2 97%   BMI 21.97 kg/m  or  Vitals:   07/16/18 1201 07/16/18 1231 07/16/18 1301 07/16/18 1331  BP: 113/66 122/73 119/71 116/71  Pulse: 74 76 75 75  Resp: 18 18 18 16   Temp:  98.7 F (37.1 C)    TempSrc:      SpO2:      Weight:      Height:       Dilation: 5 Effacement (%): 60, 70 Cervical Position: Anterior Station: -1 Presentation: Vertex Exam by:: Dr. Mauri Reading, Enis Slipper, RN FHT: baseline rate 160, moderate varibility, + acel, variable decel x 1 Toco: ctx q1.5-3 mins  Labs: Lab Results  Component Value Date   WBC 12.7 (H) 07/16/2018   HGB 13.7 07/16/2018   HCT 39.5 07/16/2018   MCV 92.1 07/16/2018   PLT 170 07/16/2018    Patient Active Problem List   Diagnosis Date Noted  . Abnormal glucose affecting pregnancy 06/03/2018  . Language barrier 06/03/2018  . Supervision of high risk pregnancy, antepartum 05/28/2018  . Gestational hypertension 05/28/2018    Assessment / Plan: 33 y.o. G1P0000 at [redacted]w[redacted]d here for IOL for gHTN  Labor: continue pitocin and frequent rotation with peanut ball to help with effacement Fetal Wellbeing:  Cat 1 Pain Control:  Epidural Anticipated MOD:  SVD gHTN: BP stable throughout labor thus far  [employer], D.O. [facility], PGY1 07/16/2018, 2:29 PM

## 2018-07-16 NOTE — Anesthesia Procedure Notes (Signed)
Epidural Patient location during procedure: OB Start time: 07/16/2018 3:49 AM End time: 07/16/2018 3:59 AM  Staffing Anesthesiologist: Leonides Grills, MD Performed: anesthesiologist   Preanesthetic Checklist Completed: patient identified, site marked, pre-op evaluation, timeout performed, IV checked, risks and benefits discussed and monitors and equipment checked  Epidural Patient position: sitting Prep: DuraPrep Patient monitoring: heart rate, cardiac monitor, continuous pulse ox and blood pressure Approach: midline Location: L4-L5 Injection technique: LOR air  Needle:  Needle type: Tuohy  Needle gauge: 17 G Needle length: 9 cm Needle insertion depth: 4 cm Catheter type: closed end flexible Catheter size: 19 Gauge Catheter at skin depth: 9 cm Test dose: negative and Other  Assessment Events: blood not aspirated, injection not painful, no injection resistance and negative IV test  Additional Notes Informed consent obtained prior to proceeding including risk of failure, 1% risk of PDPH, risk of minor discomfort and bruising. Discussed alternatives to epidural analgesia and patient desires to proceed.  Timeout performed pre-procedure verifying patient name, procedure, and platelet count.  Patient tolerated procedure well. Reason for block:procedure for pain

## 2018-07-16 NOTE — Progress Notes (Signed)
LABOR PROGRESS NOTE  Crystal Powers is a 33 y.o. G1P0000 at [redacted]w[redacted]d  admitted for IOL for gHTN  Subjective: Comfortable with epidural  Objective: BP 124/69   Pulse 84   Temp 99 F (37.2 C) (Axillary)   Resp 16   Ht 5\' 2"  (1.575 m)   Wt 54.5 kg   LMP 10/29/2017 (Exact Date)   SpO2 97%   BMI 21.97 kg/m  or  Vitals:   07/16/18 1930 07/16/18 2000 07/16/18 2030 07/16/18 2134  BP: 118/89 135/72 124/69   Pulse: 89 77 84   Resp: 16 16 16    Temp: 98.7 F (37.1 C)  98.8 F (37.1 C) 99 F (37.2 C)  TempSrc: Oral  Oral Axillary  SpO2:      Weight:      Height:        Dilation: 7 Effacement (%): 30 Cervical Position: Anterior Station: -1 Presentation: Vertex Exam by:: Mullis, K DO FHT: baseline rate 145, moderate varibility, + acel, variable decel Toco: difficult to trace and not feeling contractions  Labs: Lab Results  Component Value Date   WBC 12.7 (H) 07/16/2018   HGB 13.7 07/16/2018   HCT 39.5 07/16/2018   MCV 92.1 07/16/2018   PLT 170 07/16/2018    Patient Active Problem List   Diagnosis Date Noted  . Abnormal glucose affecting pregnancy 06/03/2018  . Language barrier 06/03/2018  . Supervision of high risk pregnancy, antepartum 05/28/2018  . Gestational hypertension 05/28/2018    Assessment / Plan: 33 y.o. G1P0000 at [redacted]w[redacted]d here for IOL for gHTN  Labor: AROM and IUPC placed Fetal Wellbeing:  Cat 2 (reassuring) Pain Control:  epidural Anticipated MOD:  SVD  Gwenevere Abbot, MD  OB Fellow  07/16/2018, 9:42 PM

## 2018-07-16 NOTE — Progress Notes (Signed)
Crystal Powers is a 33 y.o. G1P0000 at [redacted]w[redacted]d.  Subjective: Patient is asleep resting comfortably in bed with normal work of breathing  Objective: BP 106/72   Pulse 88   Temp 98 F (36.7 C) (Oral)   Resp 16   Ht 5\' 2"  (1.575 m)   Wt 54.5 kg   LMP 10/29/2017 (Exact Date)   SpO2 99%   BMI 21.97 kg/m    FHT:  FHR: 140 bpm, variability: moderate,  accelerations: no late or variable,  decelerations: none Dilation: 4 Effacement (%): Thick Cervical Position: Middle Station: -3, -2 Presentation: Vertex Exam by:: Marius Ditch, RN  Assessment / Plan: [redacted]w[redacted]d week IUP Induction of labor for gHTN  Labor: Progressing, foley bulb out, given cytotec x 4 Fetal Wellbeing:  Category 1 Pain Control:  Epidural placed gHTN: remains well controlled without medications Hypokalemia: potassium increased to 3.3; K-durr BID for one day, repeat BMP on 2/19 am if not delivered. Anticipated MOD:  NSVD  Arlyce Harman, DO 07/16/2018 5:30 AM

## 2018-07-16 NOTE — Anesthesia Preprocedure Evaluation (Signed)

## 2018-07-16 NOTE — Progress Notes (Signed)
LABOR PROGRESS NOTE  Crystal Powers is a 33 y.o. G1P0000 at [redacted]w[redacted]d  admitted for IOL for gHTN  Subjective: Comfortable with epidural; not feeling any contractions  Objective: BP 112/71 (BP Location: Right Arm)   Pulse 83   Temp 98 F (36.7 C) (Oral)   Resp 20   Ht 5\' 2"  (1.575 m)   Wt 54.5 kg   LMP 10/29/2017 (Exact Date)   SpO2 97%   BMI 21.97 kg/m  or  Vitals:   07/16/18 0800 07/16/18 0901 07/16/18 0931 07/16/18 0933  BP: 110/71 108/80 (!) 119/91 112/71  Pulse: 85 90 99 83  Resp: 18 16 20    Temp:      TempSrc:      SpO2:      Weight:      Height:        Dilation: 4.5 Effacement (%): Thick Cervical Position: Middle Station: -2 Presentation: Vertex Exam by:: Dr. Darlin Drop: baseline rate 150s, moderate varibility, + acel, no decel Toco: irritability/irregular contractions   Labs: Lab Results  Component Value Date   WBC 12.7 (H) 07/16/2018   HGB 13.7 07/16/2018   HCT 39.5 07/16/2018   MCV 92.1 07/16/2018   PLT 170 07/16/2018    Patient Active Problem List   Diagnosis Date Noted  . Abnormal glucose affecting pregnancy 06/03/2018  . Language barrier 06/03/2018  . Supervision of high risk pregnancy, antepartum 05/28/2018  . Gestational hypertension 05/28/2018    Assessment / Plan: 33 y.o. G1P0000 at [redacted]w[redacted]d here for IOL for gHTN  Labor: Will start pitocin 2x2. Reposition to assist with effacement.  Fetal Wellbeing:  Cat 1 Pain Control:  Epidural Anticipated MOD:  SVD  Gwenevere Abbot, MD  OB Fellow  07/16/2018, 9:37 AM

## 2018-07-16 NOTE — Progress Notes (Signed)
Plan of care discussed with RN. Went into room to evaluate patient because of elevated baseline and some blood coming from IUPC. Replaced IUPC, does have some bloody fluid leaking out. Exam changed from 7 to 5/80/-1. Okay to go up on pitocin, try to get adequate contractions.   Crystal Powers. Earlene Plater, DO OB/GYN Fellow

## 2018-07-17 ENCOUNTER — Encounter (HOSPITAL_COMMUNITY): Payer: Self-pay

## 2018-07-17 DIAGNOSIS — Z3490 Encounter for supervision of normal pregnancy, unspecified, unspecified trimester: Secondary | ICD-10-CM | POA: Diagnosis present

## 2018-07-17 DIAGNOSIS — O134 Gestational [pregnancy-induced] hypertension without significant proteinuria, complicating childbirth: Secondary | ICD-10-CM

## 2018-07-17 DIAGNOSIS — Z3A37 37 weeks gestation of pregnancy: Secondary | ICD-10-CM

## 2018-07-17 MED ORDER — PRENATAL MULTIVITAMIN CH
1.0000 | ORAL_TABLET | Freq: Every day | ORAL | Status: DC
  Administered 2018-07-17 – 2018-07-19 (×3): 1 via ORAL
  Filled 2018-07-17 (×3): qty 1

## 2018-07-17 MED ORDER — IBUPROFEN 600 MG PO TABS
600.0000 mg | ORAL_TABLET | Freq: Four times a day (QID) | ORAL | Status: DC
  Administered 2018-07-17 – 2018-07-19 (×9): 600 mg via ORAL
  Filled 2018-07-17 (×9): qty 1

## 2018-07-17 MED ORDER — OXYCODONE HCL 5 MG PO TABS
10.0000 mg | ORAL_TABLET | ORAL | Status: DC | PRN
  Administered 2018-07-18: 10 mg via ORAL
  Filled 2018-07-17: qty 2

## 2018-07-17 MED ORDER — SENNOSIDES-DOCUSATE SODIUM 8.6-50 MG PO TABS
2.0000 | ORAL_TABLET | ORAL | Status: DC
  Administered 2018-07-18 (×2): 2 via ORAL
  Filled 2018-07-17 (×2): qty 2

## 2018-07-17 MED ORDER — ONDANSETRON HCL 4 MG/2ML IJ SOLN: 4.0000 mg | INTRAMUSCULAR | Status: DC | PRN

## 2018-07-17 MED ORDER — SIMETHICONE 80 MG PO CHEW: 80.0000 mg | CHEWABLE_TABLET | ORAL | Status: DC | PRN

## 2018-07-17 MED ORDER — OXYCODONE HCL 5 MG PO TABS
5.0000 mg | ORAL_TABLET | ORAL | Status: DC | PRN
  Administered 2018-07-18: 5 mg via ORAL
  Filled 2018-07-17: qty 1

## 2018-07-17 MED ORDER — BENZOCAINE-MENTHOL 20-0.5 % EX AERO
1.0000 "application " | INHALATION_SPRAY | CUTANEOUS | Status: DC | PRN
  Administered 2018-07-17: 1 via TOPICAL
  Filled 2018-07-17 (×3): qty 56

## 2018-07-17 MED ORDER — ACETAMINOPHEN 500 MG PO TABS
1000.0000 mg | ORAL_TABLET | Freq: Four times a day (QID) | ORAL | Status: DC | PRN
  Administered 2018-07-17 – 2018-07-19 (×4): 1000 mg via ORAL
  Filled 2018-07-17 (×5): qty 2

## 2018-07-17 MED ORDER — COCONUT OIL OIL: 1.0000 "application " | TOPICAL_OIL | Status: DC | PRN

## 2018-07-17 MED ORDER — OXYCODONE HCL 5 MG PO TABS
5.0000 mg | ORAL_TABLET | Freq: Once | ORAL | Status: AC
  Administered 2018-07-17: 5 mg via ORAL
  Filled 2018-07-17: qty 1

## 2018-07-17 MED ORDER — DIPHENHYDRAMINE HCL 25 MG PO CAPS: 25.0000 mg | ORAL_CAPSULE | Freq: Four times a day (QID) | ORAL | Status: DC | PRN

## 2018-07-17 MED ORDER — MEASLES, MUMPS & RUBELLA VAC IJ SOLR
0.5000 mL | Freq: Once | INTRAMUSCULAR | Status: DC
  Filled 2018-07-17: qty 0.5

## 2018-07-17 MED ORDER — TETANUS-DIPHTH-ACELL PERTUSSIS 5-2.5-18.5 LF-MCG/0.5 IM SUSP: 0.5000 mL | Freq: Once | INTRAMUSCULAR | Status: DC

## 2018-07-17 MED ORDER — ONDANSETRON HCL 4 MG PO TABS: 4.0000 mg | ORAL_TABLET | ORAL | Status: DC | PRN

## 2018-07-17 MED ORDER — LIDOCAINE-EPINEPHRINE (PF) 2 %-1:200000 IJ SOLN
INTRAMUSCULAR | Status: DC | PRN
  Administered 2018-07-17 (×2): 5 mL via INTRADERMAL

## 2018-07-17 MED ORDER — WITCH HAZEL-GLYCERIN EX PADS
1.0000 "application " | MEDICATED_PAD | CUTANEOUS | Status: DC | PRN
  Administered 2018-07-17 – 2018-07-18 (×2): 1 via TOPICAL

## 2018-07-17 MED ORDER — DIBUCAINE 1 % RE OINT: 1.0000 "application " | TOPICAL_OINTMENT | RECTAL | Status: DC | PRN

## 2018-07-17 NOTE — Progress Notes (Signed)
OB/GYN Faculty Practice: Labor Progress Note  Subjective: Feeling uncomfortable. CRNA into room to reassess epidural, considering rebolus.   Objective: BP 123/84   Pulse 85   Temp 99.2 F (37.3 C) (Axillary)   Resp 18   Ht 5\' 2"  (1.575 m)   Wt 54.5 kg   LMP 10/29/2017 (Exact Date)   SpO2 97%   BMI 21.97 kg/m  Gen: uncomfortable appearing Dilation: (P) 10 Dilation Complete Date: (P) 07/17/18 Dilation Complete Time: (P) 0517 Effacement (%): 80 Cervical Position: Anterior Station: (P) Plus 1 Presentation: (P) Vertex Exam by:: (P) Marius Ditch, RN  Assessment and Plan: 33 y.o. G1P0000 [redacted]w[redacted]d here for IOL for gHTN.   Labor: Now complete, caput at +2 station and sutures +1. Will start pushing soon.  -- pain control: epidural in place, going to rebolus -- PPH Risk: medium   Fetal Well-Being: EFW 2121g (37%). Cephalic by sutures.  -- Category I - continuous fetal monitoring  -- GBS negative   GHTN: BP normal to moderate range. Asymptomatic.    Cristal Deer. Earlene Plater, DO OB/GYN Fellow, Faculty Practice  5:20 AM

## 2018-07-17 NOTE — Anesthesia Postprocedure Evaluation (Signed)
Anesthesia Post Note  Patient: Crystal Powers  Procedure(s) Performed: AN AD HOC LABOR EPIDURAL     Patient location during evaluation: Mother Baby Anesthesia Type: Epidural Level of consciousness: awake and alert and oriented Pain management: satisfactory to patient Vital Signs Assessment: post-procedure vital signs reviewed and stable Respiratory status: respiratory function stable Cardiovascular status: stable Postop Assessment: no headache, no backache, epidural receding, patient able to bend at knees, no signs of nausea or vomiting and adequate PO intake Anesthetic complications: no    Last Vitals:  Vitals:   07/17/18 1228 07/17/18 1300  BP: 123/89 (!) 137/94  Pulse: 99 98  Resp: 20 18  Temp: 37.2 C   SpO2: 100%     Last Pain:  Vitals:   07/17/18 1228  TempSrc: Oral  PainSc:    Pain Goal:                   [name]

## 2018-07-17 NOTE — Progress Notes (Signed)
Crystal Powers is a 33 y.o. G1P0000 at [redacted]w[redacted]d.  Subjective: Patient resting in bed asleep. Recently seen and evaluated by Dr. Earlene Plater per nurse request due to bloody fluid from IUPC. IUPC was taken off and replaced.  Objective: BP 125/82   Pulse 79   Temp 99.6 F (37.6 C) (Axillary)   Resp 16   Ht 5\' 2"  (1.575 m)   Wt 54.5 kg   LMP 10/29/2017 (Exact Date)   SpO2 97%   BMI 21.97 kg/m    Dilation: 5 Effacement (%): 80 Cervical Position: Anterior Station: -1 Exam by: Dr. Earlene Plater, DO  Labs: Results for orders placed or performed during the hospital encounter of 07/15/18 (from the past 24 hour(s))  CBC     Status: Abnormal   Collection Time: 07/16/18  2:52 AM  Result Value Ref Range   WBC 12.7 (H) 4.0 - 10.5 K/uL   RBC 4.29 3.87 - 5.11 MIL/uL   Hemoglobin 13.7 12.0 - 15.0 g/dL   HCT 32.2 02.5 - 42.7 %   MCV 92.1 80.0 - 100.0 fL   MCH 31.9 26.0 - 34.0 pg   MCHC 34.7 30.0 - 36.0 g/dL   RDW 06.2 37.6 - 28.3 %   Platelets 170 150 - 400 K/uL   nRBC 0.0 0.0 - 0.2 %  Comprehensive metabolic panel     Status: Abnormal   Collection Time: 07/16/18  2:52 AM  Result Value Ref Range   Sodium 135 135 - 145 mmol/L   Potassium 3.3 (L) 3.5 - 5.1 mmol/L   Chloride 107 98 - 111 mmol/L   CO2 20 (L) 22 - 32 mmol/L   Glucose, Bld 85 70 - 99 mg/dL   BUN <5 (L) 6 - 20 mg/dL   Creatinine, Ser 1.51 (L) 0.44 - 1.00 mg/dL   Calcium 8.4 (L) 8.9 - 10.3 mg/dL   Total Protein 6.5 6.5 - 8.1 g/dL   Albumin 2.9 (L) 3.5 - 5.0 g/dL   AST 20 15 - 41 U/L   ALT 13 0 - 44 U/L   Alkaline Phosphatase 126 38 - 126 U/L   Total Bilirubin 0.9 0.3 - 1.2 mg/dL   GFR calc non Af Amer >60 >60 mL/min   GFR calc Af Amer >60 >60 mL/min   Anion gap 8 5 - 15    Assessment / Plan: [redacted]w[redacted]d week IUP here with IOL due to gHTN Labor: progressing on pitocin, recently increased to get adequate contractions; if no progression at next check consider AROM Fetal Wellbeing:  Category 1 Pain Control:  epidural Anticipated MOD:   SVD  Arlyce Harman, DO 07/17/2018 1:29 AM

## 2018-07-17 NOTE — Progress Notes (Signed)
Level of sensation is just above the fundus.  Will continue to monitor.

## 2018-07-17 NOTE — Progress Notes (Addendum)
Level of sensation is at the top of sternum.  Will continue to monitor.

## 2018-07-17 NOTE — Progress Notes (Addendum)
At 0546 notified Carignan, anesthesia of level of sensation of epidural.  Level was at the jaw.  Instructed to sit patient upright and not to push epca button.  Will continue to monitor patient.

## 2018-07-17 NOTE — Lactation Note (Signed)
This note was copied from a baby's chart. Lactation Consultation Note  Patient Name: Crystal Powers Date: 07/17/2018    [facility] Note:  Per RN, mother's feeding plan is to exclusively bottle feed.  Lactation services will not be needed at this time.                   Kaylub Detienne R Woodard Perrell 07/17/2018, 8:40 PM

## 2018-07-17 NOTE — Progress Notes (Addendum)
Level of sensation is now at the middle of neck with ice.  Will continue to monitor.

## 2018-07-17 NOTE — Progress Notes (Signed)
Level of sensation is at the clavicle.  Will continue to monitor.

## 2018-07-17 NOTE — Progress Notes (Signed)
Level of sensation is just below sternum.  Will continue to monitor.

## 2018-07-17 NOTE — Progress Notes (Signed)
Level is just below clavicle.  Will continue to monitor.

## 2018-07-17 NOTE — Progress Notes (Signed)
Called into patient's room given ongoing abdominal pain despite redosing epidural. Patient lying on left side, complaining of pain over entire left side of abdomen. States she feels like skin is stretching. IUPC in place (replaced previously for high resting tone) and resting tone still 25-30, palpates firm but softer than during contractions. MVUs still not quite adequate, 180-190s. Still with bloody fluid on checks, posterior placenta per last U/S. Per RN on recent check, has progressed to 7-8 but more caput. Will pause pitocin for one hour and reassess resting tone.   Cristal Deer. Earlene Plater, DO OB/GYN Fellow

## 2018-07-17 NOTE — Discharge Summary (Addendum)
Postpartum Discharge Summary     Patient Name: Crystal Powers DOB: 20-Nov-1985 MRN: [id number]  Date of admission: 07/15/2018 Delivering Provider: Gwenevere Abbot   Date of discharge: 07/19/2018  Admitting diagnosis: INDUCTION Intrauterine pregnancy: [redacted]w[redacted]d     Secondary diagnosis:  Active Problems:   Gestational hypertension   Abnormal glucose affecting pregnancy   Perineal laceration (3A) complicating delivery   Encounter for induction of labor  Additional problems: None     Discharge diagnosis: Term Pregnancy Delivered, Gestational Hypertension and 3A perineal laceration                                                                                                Post partum procedures:None  Augmentation: AROM, Pitocin, Cytotec and Foley Balloon  Complications: None  Hospital course:  Induction of Labor With Vaginal Delivery   33 y.o. yo G1P0000 at [redacted]w[redacted]d was admitted to the hospital 07/15/2018 for induction of labor.  Indication for induction: Gestational hypertension.  Patient had an uncomplicated labor course as follows: Membrane Rupture Time/Date: 7:24 PM ,07/16/2018   Intrapartum Procedures: Episiotomy: None [1]                                         Lacerations:  3rd degree [4]  Patient had delivery of a Viable infant.  Information for the patient's newborn:  Kanise, Glace [[id number]]  Delivery Method: Vaginal, Spontaneous(Filed from Delivery Summary)   07/17/2018  Details of delivery can be found in separate delivery note.  Patient had a routine postpartum course. BPs were within normal limits and patient did not need any medications. She has a BP check scheduled one week postpartum. Due to 3rd degree laceration, patient discharge with a bowel regimen.Patient is discharged home 07/19/18.  Magnesium Sulfate recieved: No BMZ received: No  Physical exam  Vitals:   07/18/18 0538 07/18/18 1437 07/18/18 2140 07/19/18 0555  BP: 100/70 116/87 124/87 128/84  Pulse:  90 (!) 103 89 91  Resp: 18 16 16 18   Temp: 97.9 F (36.6 C) 97.8 F (36.6 C) (!) 97.4 F (36.3 C) 97.6 F (36.4 C)  TempSrc: Oral Oral Oral Oral  SpO2:  100%    Weight:      Height:       General: alert, cooperative and no distress Lochia: appropriate Uterine Fundus: firm Incision: N/A DVT Evaluation: No evidence of DVT seen on physical exam. 1+ pitting edema bilaterally.  Labs: Lab Results  Component Value Date   WBC 12.7 (H) 07/16/2018   HGB 13.7 07/16/2018   HCT 39.5 07/16/2018   MCV 92.1 07/16/2018   PLT 170 07/16/2018   CMP Latest Ref Rng & Units 07/16/2018  Glucose 70 - 99 mg/dL 85  BUN 6 - 20 mg/dL <1(K)  Creatinine 5.53 - 1.00 mg/dL 7.48(O)  Sodium 707 - 867 mmol/L 135  Potassium 3.5 - 5.1 mmol/L 3.3(L)  Chloride 98 - 111 mmol/L 107  CO2 22 - 32 mmol/L 20(L)  Calcium 8.9 - 10.3 mg/dL 5.4(G)  Total Protein 6.5 - 8.1 g/dL 6.5  Total Bilirubin 0.3 - 1.2 mg/dL 0.9  Alkaline Phos 38 - 126 U/L 126  AST 15 - 41 U/L 20  ALT 0 - 44 U/L 13    Discharge instruction: per After Visit Summary and "Baby and Me Booklet".  After visit meds:  Allergies as of 07/19/2018   No Known Allergies     Medication List    TAKE these medications   ibuprofen 600 MG tablet Commonly known as:  ADVIL,MOTRIN Take 1 tablet (600 mg total) by mouth every 6 (six) hours.   polyethylene glycol powder powder Commonly known as:  GLYCOLAX/MIRALAX Take 17 g by mouth daily.   PRENATAL VITAMIN PO Take 1 tablet by mouth daily.   senna-docusate 8.6-50 MG tablet Commonly known as:  Senokot-S Take 2 tablets by mouth daily. Start taking on:  July 20, 2018       Diet: low salt diet  Activity: Advance as tolerated. Pelvic rest for 6 weeks.   Outpatient follow up:6 weeks Follow up Appt: Future Appointments  Date Time Provider Department Center  07/24/2018  8:30 AM [facility] NURSE [facility]  07/31/2018  9:20 AM [facility] NURSE [facility]  08/14/2018  2:35 PM Magnus Sinning Dimas Alexandria, PA-C  [facility]   Follow up Visit: Follow-up Information    Center for [facility]. Go on 07/24/2018.   Specialty:  Obstetrics and Gynecology Why:  At 8:30 am for blood pressure check  Contact information: [address] [place] [address] [phone]         Please schedule this patient for Postpartum visit in: 6 weeks with the following provider: Any provider For C/S patients schedule nurse incision check in weeks 2 weeks: no, but needs perineal laceration check at 2 weeks High risk pregnancy complicated by: HTN Delivery mode:  SVD Anticipated Birth Control:  other/unsure PP Procedures needed: BP check at 1 week  Schedule Integrated BH visit: no  Newborn Data: Live born female  Birth Weight:  25030g APGAR: 9, 9  Newborn Delivery   Birth date/time:  07/17/2018 09:14:00 Delivery type:  Vaginal, Spontaneous     Baby Feeding: Breast Disposition:home with mother   07/19/2018 Joana Reamer, DO  OB FELLOW DISCHARGE ATTESTATION  I have seen and examined this patient and agree with above documentation in the resident's note.   Marcy Siren, D.O. OB Fellow  07/19/2018, 8:43 PM

## 2018-07-18 NOTE — Progress Notes (Signed)
Post Partum Day 1 Subjective: Doing well, feels tired this morning. Having cramping and some back pain. Denies headaches, blurry vision. Hasn't had much to eat yet, mostly soup yesterday. Husband helping her get up to go to bathroom because feels weak on legs. No trouble with using bathroom. Reports changing pad every hour overnight.   Objective: Blood pressure 100/70, pulse 90, temperature 97.9 F (36.6 C), temperature source Oral, resp. rate 18, height 5\' 2"  (1.575 m), weight 54.5 kg, last menstrual period 10/29/2017, SpO2 100 %, unknown if currently breastfeeding.  Physical Exam:  General: alert, NAD, tired-appearing Lochia: pad with moderate amount of blood but no clots  Uterine Fundus: firm Incision: n/a DVT Evaluation: 1+ edema bilaterally, non-pitting  Recent Labs    07/16/18 0252  HGB 13.7  HCT 39.5    Assessment/Plan: -- routine postpartum care -- discussed bleeding with RN who will monitor today and report to team if seems excessive -- continue ibuprofen for pain -- continue to monitor BP closely  -- likely d/c home tomorrow    LOS: 3 days   Azarie Coriz S Sarabella Caprio, DO 07/18/2018, 9:05 AM

## 2018-07-19 MED ORDER — IBUPROFEN 600 MG PO TABS: 600.0000 mg | ORAL_TABLET | Freq: Four times a day (QID) | ORAL | 1 refills | Status: DC

## 2018-07-19 MED ORDER — SENNOSIDES-DOCUSATE SODIUM 8.6-50 MG PO TABS: 2.0000 | ORAL_TABLET | ORAL | 0 refills | Status: DC

## 2018-07-19 MED ORDER — POLYETHYLENE GLYCOL 3350 17 GM/SCOOP PO POWD: 17.0000 g | Freq: Every day | ORAL | 0 refills | Status: DC

## 2018-07-19 NOTE — Discharge Instructions (Signed)

## 2018-07-24 ENCOUNTER — Ambulatory Visit (INDEPENDENT_AMBULATORY_CARE_PROVIDER_SITE_OTHER): Payer: Medicaid Other

## 2018-07-24 VITALS — BP 170/110 | Wt 117.2 lb

## 2018-07-24 DIAGNOSIS — O165 Unspecified maternal hypertension, complicating the puerperium: Secondary | ICD-10-CM

## 2018-07-24 MED ORDER — NIFEDIPINE ER OSMOTIC RELEASE 30 MG PO TB24: 30.0000 mg | ORAL_TABLET | Freq: Two times a day (BID) | ORAL | 2 refills | Status: AC

## 2018-07-24 NOTE — Progress Notes (Signed)
Video Interpreter # [id number] Pt here today for BP check s/p vaginal delivery with dx gestational hypertension.  Pt denies any blurry vision or headache.  BP 170/110 manually in RA.  Notified Dr. Macon Large pt's BP.  Provider recommendation to have CMET and CBC labs drawn today, start taking Procardia 30 mg tablet po bid, and to return on Friday (07/26/18) for BP check.  I informed pt what provider recommendations were.  Pt agreed.  I advised pt to go pick up medication today so that the medication will be in her system for at least two days.   Pt stated understanding with no further questions.

## 2018-07-24 NOTE — Progress Notes (Signed)
I have reviewed the chart and agree with nursing staff's documentation of this patient's encounter.  Crystal Anyanwu, MD 07/24/2018 1:01 PM    

## 2018-07-25 LAB — COMPREHENSIVE METABOLIC PANEL
ALT: 25 IU/L (ref 0–32)
AST: 20 IU/L (ref 0–40)
Albumin/Globulin Ratio: 1.5 (ref 1.2–2.2)
Albumin: 3.9 g/dL (ref 3.8–4.8)
Alkaline Phosphatase: 131 IU/L — ABNORMAL HIGH (ref 39–117)
BILIRUBIN TOTAL: 0.3 mg/dL (ref 0.0–1.2)
BUN/Creatinine Ratio: 20 (ref 9–23)
BUN: 11 mg/dL (ref 6–20)
CO2: 20 mmol/L (ref 20–29)
Calcium: 9.3 mg/dL (ref 8.7–10.2)
Chloride: 104 mmol/L (ref 96–106)
Creatinine, Ser: 0.54 mg/dL — ABNORMAL LOW (ref 0.57–1.00)
GFR calc Af Amer: 142 mL/min/{1.73_m2} (ref >59)
GFR calc non Af Amer: 124 mL/min/{1.73_m2} (ref >59)
Globulin, Total: 2.6 g/dL (ref 1.5–4.5)
Glucose: 76 mg/dL (ref 65–99)
Potassium: 3.5 mmol/L (ref 3.5–5.2)
SODIUM: 142 mmol/L (ref 134–144)
Total Protein: 6.5 g/dL (ref 6.0–8.5)

## 2018-07-25 LAB — CBC
Hematocrit: 36 % (ref 34.0–46.6)
Hemoglobin: 12.4 g/dL (ref 11.1–15.9)
MCH: 30.8 pg (ref 26.6–33.0)
MCHC: 34.4 g/dL (ref 31.5–35.7)
MCV: 90 fL (ref 79–97)
Platelets: 308 10*3/uL (ref 150–450)
RBC: 4.02 x10E6/uL (ref 3.77–5.28)
RDW: 13 % (ref 11.7–15.4)
WBC: 5.3 10*3/uL (ref 3.4–10.8)

## 2018-07-26 ENCOUNTER — Ambulatory Visit (INDEPENDENT_AMBULATORY_CARE_PROVIDER_SITE_OTHER): Payer: Medicaid Other | Admitting: *Deleted

## 2018-07-26 ENCOUNTER — Other Ambulatory Visit: Payer: Self-pay

## 2018-07-26 VITALS — BP 148/96 | HR 124

## 2018-07-26 DIAGNOSIS — O133 Gestational [pregnancy-induced] hypertension without significant proteinuria, third trimester: Secondary | ICD-10-CM

## 2018-07-26 NOTE — Progress Notes (Signed)
Here for bp check after GHTN with vaginal delivery 07/17/18.Came for bp check 07/24/18 and very elevated- started on procardia. She states she is taking procardia. Denies headaches or visual disturbances. 1+ edema noted in feet/ ankles./lower legs Discussed bp's today and assessment with Dr. Macon Large- no new orders. Instructed patient to continue procardia as ordered, keep already scheduled appointment for 07/31/18 and go to [facility] if she develops severe headache, worsening edema, etc. She voices understanding. Amariona Rathje,RN

## 2018-07-28 NOTE — Progress Notes (Signed)
I have reviewed the chart and agree with nursing staff's documentation of this patient's encounter.  Shyne Resch, MD 07/28/2018 9:09 AM    

## 2018-07-31 ENCOUNTER — Ambulatory Visit: Payer: Medicaid Other | Admitting: Obstetrics & Gynecology

## 2018-07-31 VITALS — BP 135/87 | HR 134 | Wt 107.3 lb

## 2018-07-31 DIAGNOSIS — Z789 Other specified health status: Secondary | ICD-10-CM

## 2018-07-31 NOTE — Progress Notes (Signed)
   Subjective:    Patient ID: Crystal Powers, female    DOB: Dec 22, 1985, 33 y.o.   MRN: [id number]  HPI 33 yo P1 here for a perineal check 2 weeks after a vaginal delivery with a 3A laceration. She denies any problems, has not had sex yet, is uncertain what she wants for contraception.   Review of Systems     Objective:   Physical Exam Breathing, conversing, and ambulating normally Well nourished, well hydrated Asian female, no apparent distress Live interpretor present for exam Well healed perineum I removed a stitch that was just hanging on     Assessment & Plan:  2 week perineal check- doing well GHTN- asymtomatic, on procardia Come back in 2 weeks for postpartum visit

## 2018-07-31 NOTE — Progress Notes (Signed)
Language Resources [employer]

## 2018-08-14 ENCOUNTER — Other Ambulatory Visit: Payer: Self-pay

## 2018-08-14 ENCOUNTER — Telehealth: Payer: Self-pay

## 2018-08-14 ENCOUNTER — Ambulatory Visit (INDEPENDENT_AMBULATORY_CARE_PROVIDER_SITE_OTHER): Payer: Medicaid Other | Admitting: Medical

## 2018-08-14 ENCOUNTER — Encounter: Payer: Self-pay | Admitting: Medical

## 2018-08-14 DIAGNOSIS — Z1389 Encounter for screening for other disorder: Secondary | ICD-10-CM | POA: Diagnosis not present

## 2018-08-14 DIAGNOSIS — Z8759 Personal history of other complications of pregnancy, childbirth and the puerperium: Secondary | ICD-10-CM

## 2018-08-14 NOTE — Telephone Encounter (Signed)
Called pt IT consultant id # [phone] from [employer]. Pt state will be keeping appointment. Went over current check in process. Pt verbalized understanding.

## 2018-08-14 NOTE — Progress Notes (Signed)
Pt states is still undecided on BC.

## 2018-08-14 NOTE — Patient Instructions (Signed)
Etonogestrel implant  What is this medicine?  ETONOGESTREL (et oh noe JES trel) is a contraceptive (birth control) device. It is used to prevent pregnancy. It can be used for up to 3 years.  This medicine may be used for other purposes; ask your health care provider or pharmacist if you have questions.  COMMON BRAND NAME(S): Implanon, Nexplanon  What should I tell my health care provider before I take this medicine?  They need to know if you have any of these conditions:  -abnormal vaginal bleeding  -blood vessel disease or blood clots  -breast, cervical, endometrial, ovarian, liver, or uterine cancer  -diabetes  -gallbladder disease  -heart disease or recent heart attack  -high blood pressure  -high cholesterol or triglycerides  -kidney disease  -liver disease  -migraine headaches  -seizures  -stroke  -tobacco smoker  -an unusual or allergic reaction to etonogestrel, anesthetics or antiseptics, other medicines, foods, dyes, or preservatives  -pregnant or trying to get pregnant  -breast-feeding  How should I use this medicine?  This device is inserted just under the skin on the inner side of your upper arm by a health care professional.  Talk to your pediatrician regarding the use of this medicine in children. Special care may be needed.  Overdosage: If you think you have taken too much of this medicine contact a poison control center or emergency room at once.  NOTE: This medicine is only for you. Do not share this medicine with others.  What if I miss a dose?  This does not apply.  What may interact with this medicine?  Do not take this medicine with any of the following medications:  -amprenavir  -fosamprenavir  This medicine may also interact with the following medications:  -acitretin  -aprepitant  -armodafinil  -bexarotene  -bosentan  -carbamazepine  -certain medicines for fungal infections like fluconazole, ketoconazole, itraconazole and voriconazole  -certain medicines to treat hepatitis, HIV or  AIDS  -cyclosporine  -felbamate  -griseofulvin  -lamotrigine  -modafinil  -oxcarbazepine  -phenobarbital  -phenytoin  -primidone  -rifabutin  -rifampin  -rifapentine  -St. John's wort  -topiramate  This list may not describe all possible interactions. Give your health care provider a list of all the medicines, herbs, non-prescription drugs, or dietary supplements you use. Also tell them if you smoke, drink alcohol, or use illegal drugs. Some items may interact with your medicine.  What should I watch for while using this medicine?  This product does not protect you against HIV infection (AIDS) or other sexually transmitted diseases.  You should be able to feel the implant by pressing your fingertips over the skin where it was inserted. Contact your doctor if you cannot feel the implant, and use a non-hormonal birth control method (such as condoms) until your doctor confirms that the implant is in place. Contact your doctor if you think that the implant may have broken or become bent while in your arm.  You will receive a user card from your health care provider after the implant is inserted. The card is a record of the location of the implant in your upper arm and when it should be removed. Keep this card with your health records.  What side effects may I notice from receiving this medicine?  Side effects that you should report to your doctor or health care professional as soon as possible:  -allergic reactions like skin rash, itching or hives, swelling of the face, lips, or tongue  -breast lumps, breast tissue   changes, or discharge  -breathing problems  -changes in emotions or moods  -if you feel that the implant may have broken or bent while in your arm  -high blood pressure  -pain, irritation, swelling, or bruising at the insertion site  -scar at site of insertion  -signs of infection at the insertion site such as fever, and skin redness, pain or discharge  -signs and symptoms of a blood clot such as breathing  problems; changes in vision; chest pain; severe, sudden headache; pain, swelling, warmth in the leg; trouble speaking; sudden numbness or weakness of the face, arm or leg  -signs and symptoms of liver injury like dark yellow or brown urine; general ill feeling or flu-like symptoms; light-colored stools; loss of appetite; nausea; right upper belly pain; unusually weak or tired; yellowing of the eyes or skin  -unusual vaginal bleeding, discharge  Side effects that usually do not require medical attention (report to your doctor or health care professional if they continue or are bothersome):  -acne  -breast pain or tenderness  -headache  -irregular menstrual bleeding  -nausea  This list may not describe all possible side effects. Call your doctor for medical advice about side effects. You may report side effects to FDA at 1-800-FDA-1088.  Where should I keep my medicine?  This drug is given in a hospital or clinic and will not be stored at home.  NOTE: This sheet is a summary. It may not cover all possible information. If you have questions about this medicine, talk to your doctor, pharmacist, or health care provider.   2019 Elsevier/Gold Standard (2017-04-03 14:11:42)

## 2018-08-14 NOTE — Progress Notes (Signed)
Subjective:     Crystal Powers is a 33 y.o. female who presents for a postpartum visit. She is 4 weeks postpartum following a spontaneous vaginal delivery. I have fully reviewed the prenatal and intrapartum course. The delivery was at 37/2 gestational weeks. Outcome: spontaneous vaginal delivery. Anesthesia: epidural. Postpartum course has been normal. Baby's course has been normal. Baby is feeding by bottle - Similac Neosure. Bleeding no bleeding. Bowel function is normal. Bladder function is normal. Patient is not sexually active. Contraception method is none. Postpartum depression screening: negative. Patient continues to take Procardia daily. She denies chest pain, headache or changes in vision today.   The following portions of the patient's history were reviewed and updated as appropriate: allergies, current medications, past family history, past medical history, past social history, past surgical history and problem list.  Review of Systems Pertinent items are noted in HPI.   Objective:    BP 138/87   Pulse (!) 138   Temp 98.3 F (36.8 C)   Ht 5\' 1"  (1.549 m)   Wt 106 lb 8 oz (48.3 kg)   Breastfeeding No   BMI 20.12 kg/m   Manual recheck pulse: 112 General:  alert and cooperative   Breasts:  deferred, bottle feeding  Lungs: clear to auscultation bilaterally  Heart:  tachycardic, regular rhythm noted, no abnomral sounds  Abdomen: soft, non-tender; bowel sounds normal; no masses,  no organomegaly   Vulva:  not evaluated  Vagina: not evaluated  Cervix:  not evaluated  Corpus: not examined  Adnexa:  not evaluated  Rectal Exam: Not performed.        Assessment:     Normal postpartum exam. Pap smear not done at today's visit. Last pap smear was normal 2019 History of gestational Hypertension  Plan:    1. Contraception: abstinence and condoms advised until patient ready to have Nexplanon placed, does not desire today.  2. Follow up in: 2 weeks for BP check or sooner as  needed.   Patient encouraged to call for appointment when she is ready for Nexplanon insertion   Kathlene Cote 08/14/2018 2:54 PM

## 2018-08-19 ENCOUNTER — Encounter: Payer: Self-pay | Admitting: *Deleted

## 2018-08-28 ENCOUNTER — Ambulatory Visit: Payer: Medicaid Other | Admitting: *Deleted

## 2018-08-28 ENCOUNTER — Other Ambulatory Visit: Payer: Self-pay

## 2018-08-28 DIAGNOSIS — Z8759 Personal history of other complications of pregnancy, childbirth and the puerperium: Secondary | ICD-10-CM

## 2018-08-28 DIAGNOSIS — Z0130 Encounter for examination of blood pressure without abnormal findings: Secondary | ICD-10-CM

## 2018-08-28 NOTE — Progress Notes (Addendum)
Here for blood pressure check. Had GHTN. Had postpartum visit on 08/14/18 and was on procardia which she is still taking.  T. 99.1 today- denies symptoms of coronoavirus. Instructed if her temperature goes up and she develops cough, fever, etc to call a doctor for instructions.  Bp 126/80.Patient states was told could stop medicine after this visit.  Reviewed with Nolene Bernheim, NP and instructed patient to stop taking procardia, blood presure check in 2 weeks .  Pt. Also wants to make appt for nexplanon. Informed her due to covid 19 not making these appointments - and to call us back in a few weeks. She voices understanding.  Joanathan Affeldt,RN   Reviewed the documentation and agree with the plan of care. Client may begin Depo in 2 weeks if desired until a nexplanon can be inserted.  Nolene Bernheim, RN, MSN, NP-BC Nurse Practitioner, [facility] for [employer], [facility] 08/28/2018 5:32 PM

## 2018-09-10 ENCOUNTER — Telehealth: Payer: Self-pay | Admitting: Family Medicine

## 2018-09-10 NOTE — Telephone Encounter (Signed)
Attempted to call patient to inform her about the office's new address and procedure change. No answer, left detailed message with this information.

## 2018-09-13 ENCOUNTER — Other Ambulatory Visit: Payer: Self-pay

## 2018-09-13 ENCOUNTER — Ambulatory Visit (INDEPENDENT_AMBULATORY_CARE_PROVIDER_SITE_OTHER): Payer: Medicaid Other | Admitting: *Deleted

## 2018-09-13 ENCOUNTER — Encounter: Payer: Self-pay | Admitting: *Deleted

## 2018-09-13 VITALS — BP 148/110 | HR 114 | Temp 98.2°F | Ht 62.0 in | Wt 106.7 lb

## 2018-09-13 DIAGNOSIS — Z0130 Encounter for examination of blood pressure without abnormal findings: Secondary | ICD-10-CM

## 2018-09-13 DIAGNOSIS — R000 Tachycardia, unspecified: Secondary | ICD-10-CM

## 2018-09-13 DIAGNOSIS — I10 Essential (primary) hypertension: Secondary | ICD-10-CM

## 2018-09-13 NOTE — Progress Notes (Signed)
Pt here for BP check. She reports stopping Procardia following visit on 4/1 as instructed. She denies H/A, epigastric pain or visual disturbances. Consult w/Dr. Debroah Loop following BP check. Pt advised to resume procardia 30 mg XL - 1 tablet daily. Pt will be referred to PCP for management of HTN. Pt voiced understanding of information given. She desires Nexplanon for contraception and will be scheduled for office appt.

## 2018-09-16 NOTE — Progress Notes (Signed)
Patient ID: Crystal Powers, female   DOB: May 23, 1986, 33 y.o.   MRN: [id number] I have reviewed the chart and agree with nursing staff's documentation of this patient's encounter.  Scheryl Darter, MD 09/16/2018 9:28 AM

## 2018-09-17 NOTE — Addendum Note (Signed)
Addended by: Jill Side on: 09/17/2018 04:48 PM   Modules accepted: Orders

## 2018-09-25 ENCOUNTER — Telehealth: Payer: Self-pay | Admitting: Medical

## 2018-09-25 NOTE — Telephone Encounter (Signed)
Called the patient to inform of upcoming appointment, left a detailed voicemail regarding the date and time of appointment.

## 2018-09-27 ENCOUNTER — Encounter: Payer: Self-pay | Admitting: Obstetrics and Gynecology

## 2018-09-27 ENCOUNTER — Ambulatory Visit: Payer: Medicaid Other

## 2018-09-27 ENCOUNTER — Other Ambulatory Visit: Payer: Self-pay

## 2018-09-27 ENCOUNTER — Ambulatory Visit (INDEPENDENT_AMBULATORY_CARE_PROVIDER_SITE_OTHER): Payer: Medicaid Other | Admitting: Obstetrics and Gynecology

## 2018-09-27 DIAGNOSIS — Z30017 Encounter for initial prescription of implantable subdermal contraceptive: Secondary | ICD-10-CM | POA: Diagnosis not present

## 2018-09-27 DIAGNOSIS — Z3202 Encounter for pregnancy test, result negative: Secondary | ICD-10-CM

## 2018-09-27 LAB — POCT PREGNANCY, URINE: Preg Test, Ur: NEGATIVE

## 2018-09-27 MED ORDER — ETONOGESTREL 68 MG ~~LOC~~ IMPL
68.0000 mg | DRUG_IMPLANT | Freq: Once | SUBCUTANEOUS | Status: AC
  Administered 2018-09-27: 68 mg via SUBCUTANEOUS

## 2018-09-27 NOTE — Progress Notes (Signed)
Pt states she is nervous in relation to blood pressure being high today. Pt reports taking her blood pressure medication this morning.

## 2018-09-27 NOTE — Patient Instructions (Signed)
Etonogestrel implant  What is this medicine?  ETONOGESTREL (et oh noe JES trel) is a contraceptive (birth control) device. It is used to prevent pregnancy. It can be used for up to 3 years.  This medicine may be used for other purposes; ask your health care provider or pharmacist if you have questions.  COMMON BRAND NAME(S): Implanon, Nexplanon  What should I tell my health care provider before I take this medicine?  They need to know if you have any of these conditions:  -abnormal vaginal bleeding  -blood vessel disease or blood clots  -breast, cervical, endometrial, ovarian, liver, or uterine cancer  -diabetes  -gallbladder disease  -heart disease or recent heart attack  -high blood pressure  -high cholesterol or triglycerides  -kidney disease  -liver disease  -migraine headaches  -seizures  -stroke  -tobacco smoker  -an unusual or allergic reaction to etonogestrel, anesthetics or antiseptics, other medicines, foods, dyes, or preservatives  -pregnant or trying to get pregnant  -breast-feeding  How should I use this medicine?  This device is inserted just under the skin on the inner side of your upper arm by a health care professional.  Talk to your pediatrician regarding the use of this medicine in children. Special care may be needed.  Overdosage: If you think you have taken too much of this medicine contact a poison control center or emergency room at once.  NOTE: This medicine is only for you. Do not share this medicine with others.  What if I miss a dose?  This does not apply.  What may interact with this medicine?  Do not take this medicine with any of the following medications:  -amprenavir  -fosamprenavir  This medicine may also interact with the following medications:  -acitretin  -aprepitant  -armodafinil  -bexarotene  -bosentan  -carbamazepine  -certain medicines for fungal infections like fluconazole, ketoconazole, itraconazole and voriconazole  -certain medicines to treat hepatitis, HIV or  AIDS  -cyclosporine  -felbamate  -griseofulvin  -lamotrigine  -modafinil  -oxcarbazepine  -phenobarbital  -phenytoin  -primidone  -rifabutin  -rifampin  -rifapentine  -St. John's wort  -topiramate  This list may not describe all possible interactions. Give your health care provider a list of all the medicines, herbs, non-prescription drugs, or dietary supplements you use. Also tell them if you smoke, drink alcohol, or use illegal drugs. Some items may interact with your medicine.  What should I watch for while using this medicine?  This product does not protect you against HIV infection (AIDS) or other sexually transmitted diseases.  You should be able to feel the implant by pressing your fingertips over the skin where it was inserted. Contact your doctor if you cannot feel the implant, and use a non-hormonal birth control method (such as condoms) until your doctor confirms that the implant is in place. Contact your doctor if you think that the implant may have broken or become bent while in your arm.  You will receive a user card from your health care provider after the implant is inserted. The card is a record of the location of the implant in your upper arm and when it should be removed. Keep this card with your health records.  What side effects may I notice from receiving this medicine?  Side effects that you should report to your doctor or health care professional as soon as possible:  -allergic reactions like skin rash, itching or hives, swelling of the face, lips, or tongue  -breast lumps, breast tissue   changes, or discharge  -breathing problems  -changes in emotions or moods  -if you feel that the implant may have broken or bent while in your arm  -high blood pressure  -pain, irritation, swelling, or bruising at the insertion site  -scar at site of insertion  -signs of infection at the insertion site such as fever, and skin redness, pain or discharge  -signs and symptoms of a blood clot such as breathing  problems; changes in vision; chest pain; severe, sudden headache; pain, swelling, warmth in the leg; trouble speaking; sudden numbness or weakness of the face, arm or leg  -signs and symptoms of liver injury like dark yellow or brown urine; general ill feeling or flu-like symptoms; light-colored stools; loss of appetite; nausea; right upper belly pain; unusually weak or tired; yellowing of the eyes or skin  -unusual vaginal bleeding, discharge  Side effects that usually do not require medical attention (report to your doctor or health care professional if they continue or are bothersome):  -acne  -breast pain or tenderness  -headache  -irregular menstrual bleeding  -nausea  This list may not describe all possible side effects. Call your doctor for medical advice about side effects. You may report side effects to FDA at 1-800-FDA-1088.  Where should I keep my medicine?  This drug is given in a hospital or clinic and will not be stored at home.  NOTE: This sheet is a summary. It may not cover all possible information. If you have questions about this medicine, talk to your doctor, pharmacist, or health care provider.   2019 Elsevier/Gold Standard (2017-04-03 14:11:42)

## 2018-09-27 NOTE — Progress Notes (Signed)
Patient ID: Crystal Powers, female   DOB: July 31, 1985, 34 y.o.   MRN: [id number]     GYNECOLOGY CLINIC PROCEDURE NOTE  Crystal Powers is a 33 y.o. G1P1001 here for  Nexplanon insertion.    No other gynecologic concerns.  Nexplanon Insertion Procedure Patient identified, informed consent performed, consent signed.   Patient does understand that irregular bleeding is a very common side effect of this medication. She was advised to have backup contraception for one week after placement. Pregnancy test in clinic today was negative.  Appropriate time out taken.  Patient's left arm was prepped and draped in the usual sterile fashion.. The ruler used to measure and mark insertion area.  Patient was prepped with alcohol swab and then injected with 3 ml of 1% lidocaine.  She was prepped with betadine, Nexplanon removed from packaging,  Device confirmed in needle, then inserted full length of needle and withdrawn per handbook instructions. Nexplanon was able to palpated in the patient's arm; patient palpated the insert herself. There was minimal blood loss.  Patient insertion site covered with guaze and a pressure bandage to reduce any bruising.  The patient tolerated the procedure well and was given post procedure instructions.    Pt instructed to continue with Procardia and follow up with PCP. Listed provided to pt  Nettie Elm, MD Attending Obstetrician & Gynecologist Center for [facility], [facility]

## 2018-12-31 ENCOUNTER — Other Ambulatory Visit: Payer: Self-pay

## 2018-12-31 DIAGNOSIS — Z20822 Contact with and (suspected) exposure to covid-19: Secondary | ICD-10-CM

## 2019-01-01 LAB — NOVEL CORONAVIRUS, NAA: SARS-CoV-2, NAA: NOT DETECTED

## 2020-10-27 IMAGING — US US MFM AMNIOCENTESIS
1 series · 6 of 6 positions shown · non-contrast
Comparison: none

[Series 1: us mfm amniocentesis · 6 of 6 slices shown]
[im 1/6]
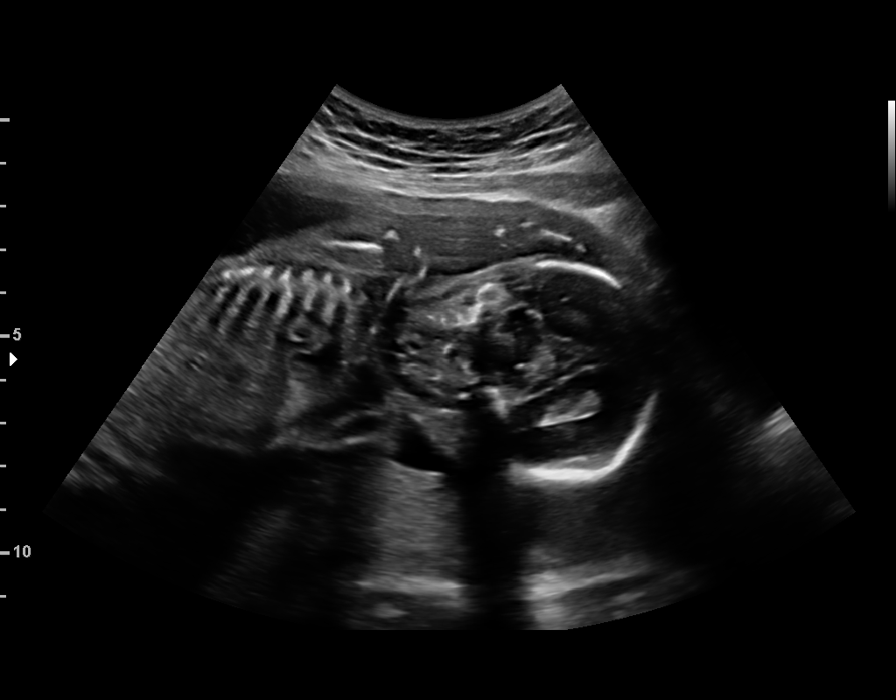
[im 2/6]
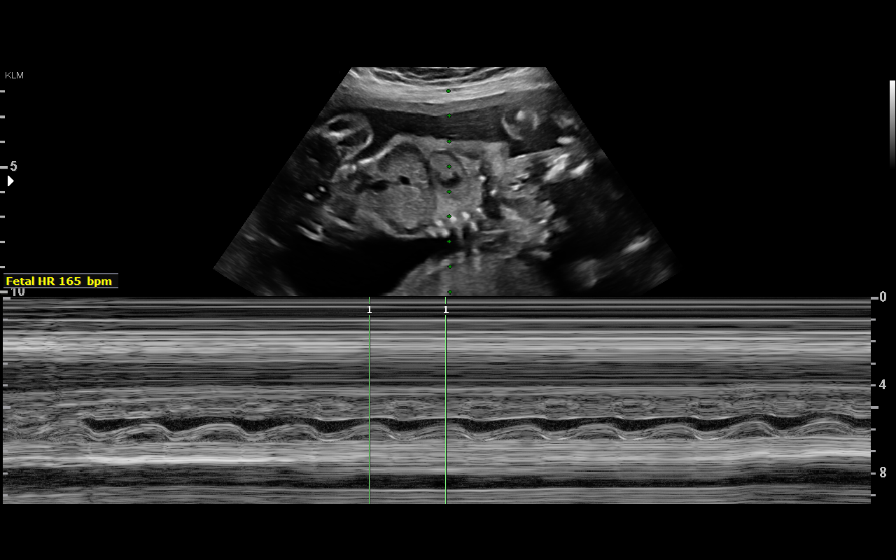
[im 3/6]
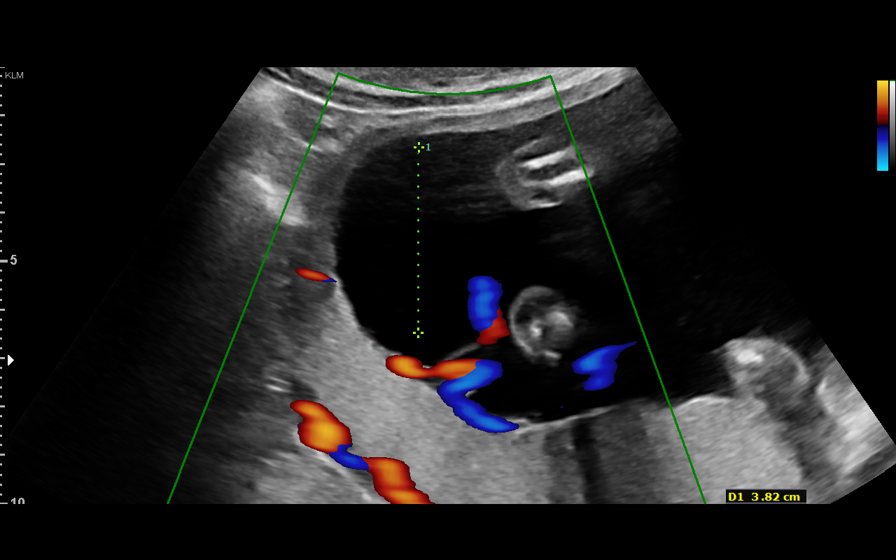
[im 4/6]
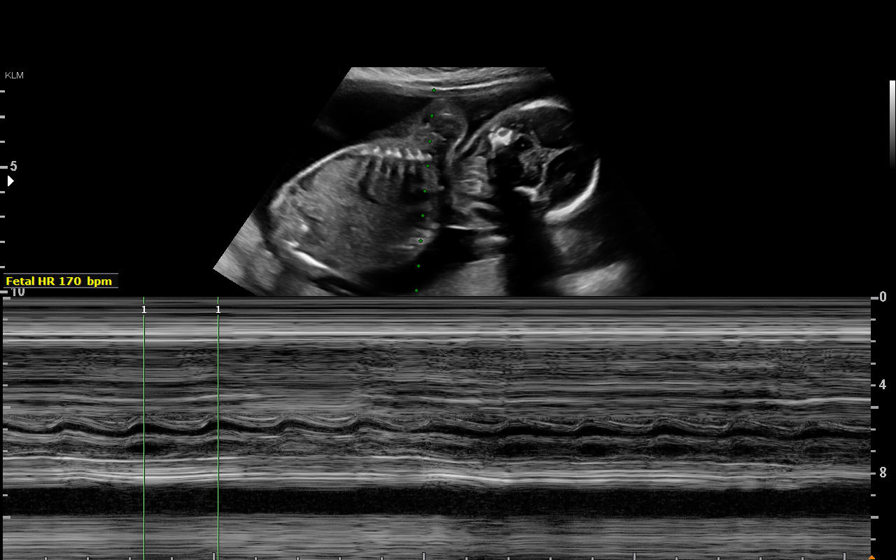
[im 5/6]
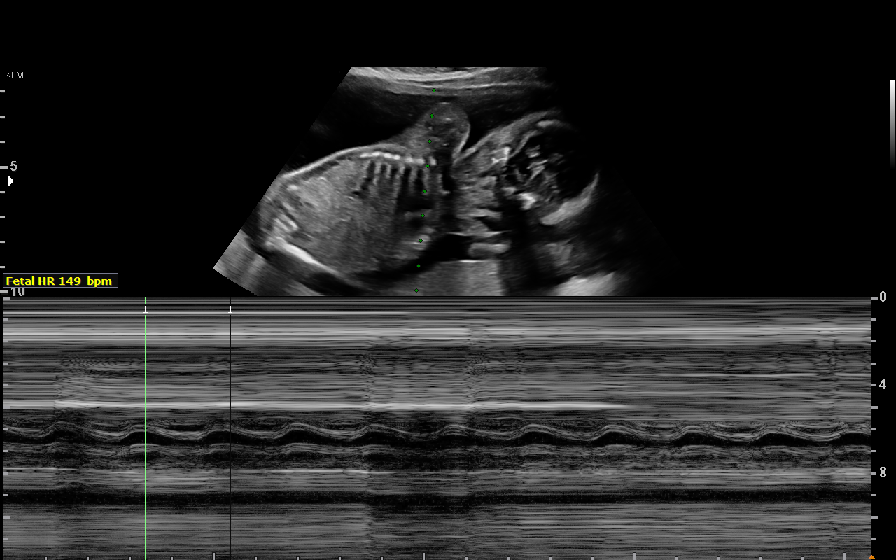
[im 6/6]
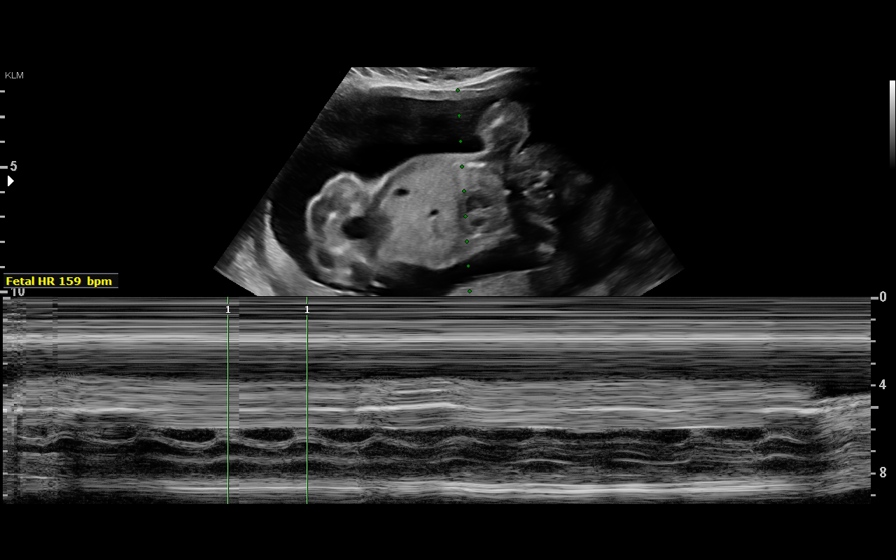

[6 of 6 positions shown; findings below may reference images not displayed]

Health
                   AGIES NP

                                                       AGIES
 ----------------------------------------------------------------------

 ----------------------------------------------------------------------
Indications

  Encounter for antenatal screening for
  malformations
  Abnormal biochemical screen (quad) for
  Trisomy 21
  Fetal abnormality - other known or
  suspected (i.e. choriod plexus cyst, EIF,
  renal pyelectasis)
  22 weeks gestation of pregnancy
 ----------------------------------------------------------------------
Fetal Evaluation

 Num Of Fetuses:         1
 Fetal Heart Rate(bpm):  162
 Cardiac Activity:       Observed
 Presentation:           Variable
 Placenta:               Posterior
 P. Cord Insertion:      Visualized, central

 Amniotic Fluid
 AFI FV:      Within normal limits

                             Largest Pocket(cm)

Biometry

 BPD:      50.2  mm     G. Age:  21w 1d          7  %    CI:        75.65   %    70 - 86
                                                         FL/HC:      18.5   %    19.2 -
 HC:       183   mm     G. Age:  20w 5d        < 3  %    HC/AC:      1.14        1.05 -
 AC:      159.9  mm     G. Age:  21w 1d          8  %    FL/BPD:     67.3   %    71 - 87
 FL:       33.8  mm     G. Age:  20w 4d        < 3  %    FL/AC:      21.1   %    20 - 24
 CER:      22.2  mm     G. Age:  21w 1d         14  %
 NFT:       5.2  mm
 CM:          3  mm

 Est. FW:     383  gm    0 lb 14 oz      16  %
OB History

 Gravidity:    1
Gestational Age

 LMP:           22w 4d        Date:  10/29/17                 EDD:   08/05/18
 U/S Today:     20w 6d                                        EDD:   08/17/18
 Best:          22w 4d     Det. By:  LMP  (10/29/17)          EDD:   08/05/18
Anatomy

 Cranium:               Appears normal         LVOT:                   Appears normal
 Cavum:                 Appears normal         Aortic Arch:            Appears normal
 Ventricles:            Appears normal         Ductal Arch:            Appears normal
 Choroid Plexus:        Appears normal         Diaphragm:              Appears normal
 Cerebellum:            Appears normal         Stomach:                Appears normal, left
                                                                       sided
 Posterior Fossa:       Appears normal         Abdomen:                Appears normal
 Nuchal Fold:           Appears normal         Abdominal Wall:         Appears nml (cord
                                                                       insert, abd wall)
 Face:                  Appears normal         Cord Vessels:           Appears normal (3
                        (orbits and profile)                           vessel cord)
 Lips:                  Appears normal         Kidneys:                Bl pyelectasis, Rt
                                                                       L 4 mm, Lt 8.7 m
 Palate:                Not well visualized    Bladder:                Appears normal
 Thoracic:              Appears normal         Spine:                  Appears normal
 Heart:                 Echogenic focus        Upper Extremities:      Appears normal
                        in LV
 RVOT:                  Appears normal         Lower Extremities:      Appears normal

 Other:  Female gender Heels and 5th digit visualized. Left pyelectasis, pelvis
         AP:8.7 mm, left ureter:2.3 mm
Guided Procedures

 Type:   Amniocentesis

 FH Post Procedure:     Normal             RH Type:          A positive
 Rh Immune Globulin:    Not required,      Discharge Inst.:  Post-procedure
                        Rh positive                          instructions
                                                             given
 Needle Insertions:     22 gauge x 1       Vol. Withdrawn:   27 ml of clear
                                                             amniotic fluid
 Catheter Passes:       1 pass
 Transabdominal:        Yes

 Complications:  None

 Comment:                    Informed consent was obtained. A "time-out" was performed before the procedure. Patient
                             tolerated the procedure well. We gave her post-procedure instructions.
Cervix Uterus Adnexa
 Cervix
 Length:           4.78  cm.
 Normal appearance by transabdominal scan.

 Uterus
 No abnormality visualized.

 Cul De Sac
 No free fluid seen.

 Adnexa
 No abnormality visualized.
Impression

 Ms. Baje, Biado1 P0, is here for fetal anatomy scan. On serum
 screening (quad), the risk for Down syndrome was increased
 (1 in 233).
 We performed a fetal anatomy scan. Bilateral pyelectases
 are seen (right measuring 4 millimeters and the left 8 mm).
 An echogenic intracardiac focus is seen in the left ventricle.
 No other markers of aneuploidies or fetal structural defects
 are seen. Fetal biometry lags established gestational age by
 13 days. Amniotic fluid is normal and good fetal activity is
 seen.

 I explained the significance of serum screening (dated by
 LMP) and ultrasound markers of Down syndrome.
 I counseled the patient on the following options: 1) Maternal
 blood cell-free fetal DNA screening  for trisomies 21, 18 and
 13, which has a greater detection rate than conventional
 screening tests. I informed the patient that not all
 chromosomal malformations are detected by this test. 2) I
 informed her that only amniocentesis will give a definitive
 result on the fetal karyotype. I discussed a procedure-related
 pregnancy loss of about 1 in 400.

 After ultrasound, the patient had genetic counseling at our
 office.

 Patient understands the limitations of ultrasound in detecting
 fetal anomalies and opted to have amniocentesis.
 I explained the procedure and possible complications
 including miscarriage (1 in 400 procedures). Patient opted for
 amniocentesis.

 After informed consent, amniocentesis was performed by Dr.
 Mmentor under ultrasound guidance and 27 milliliters of clear
 amniotic fluid was withdrawn. Fluid was sent for AFP, FISH,
 fetal karyotype and microarray analysis. Patient tolerated the
 procedure well. Post-procedure fetal heart rate was normal
 (sonographer during the procedure: Abdikadir Londono).
Recommendations

 -We will communicate the results to the patient and fax a
 copy to your office.
 -An appointment was made for her to return in 4 weeks for
 fetal growth and renal assessments.
                 Orcasita, Arcia

## 2021-01-04 IMAGING — US US MFM FETAL BPP W/O NON-STRESS
1 series · 15 of 28 positions shown · non-contrast
Comparison: none

[Series 1: us mfm fetal bpp w/o non-stress · 29 acquisitions, 15 frames shown]
[im 1/29]
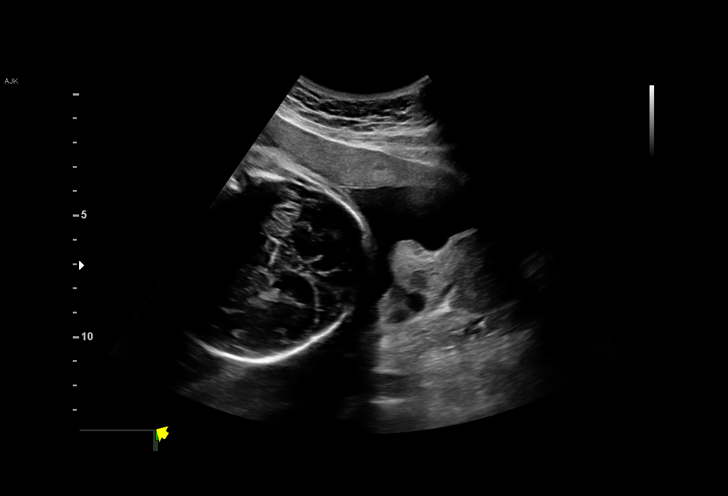
[im 3/29]
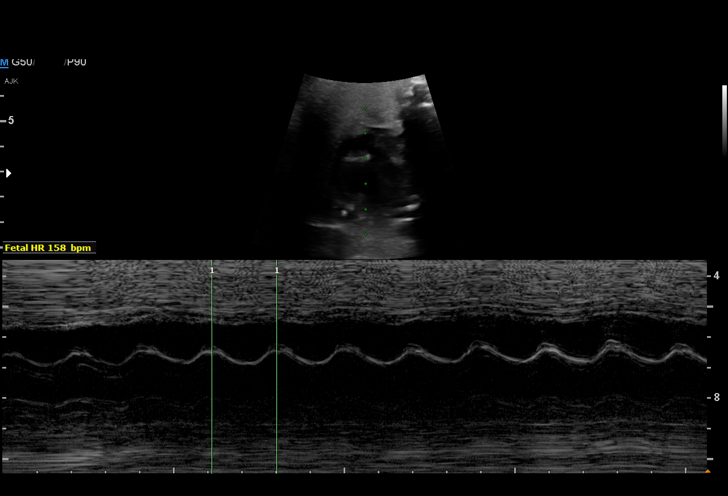
[im 5/29]
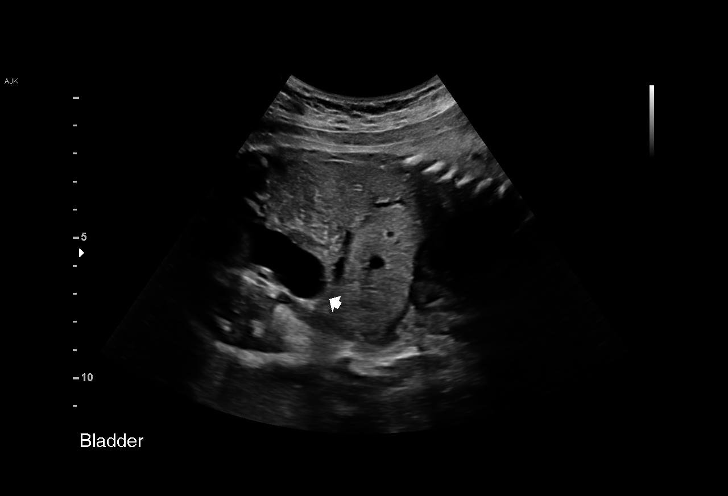
[im 7/29]
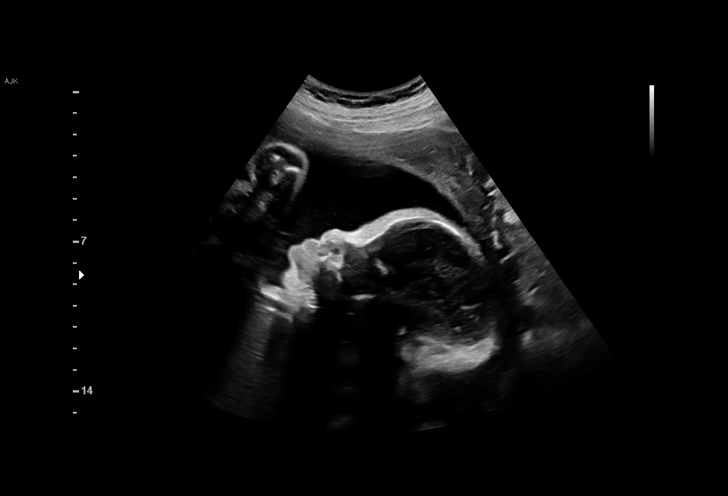
[im 9/29]
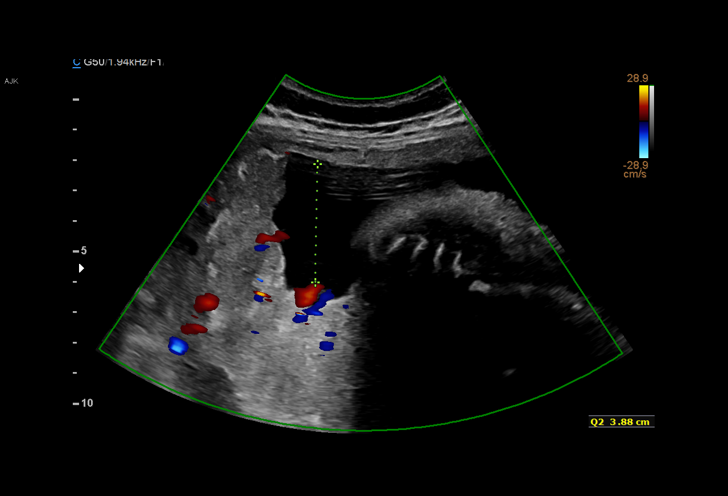
[im 11/29]
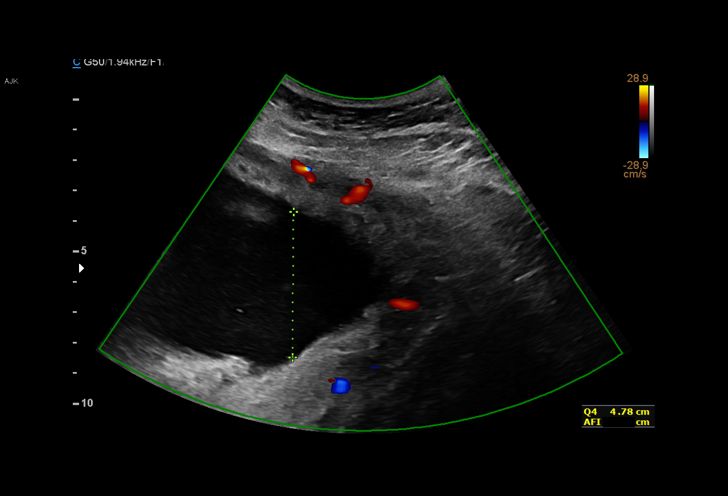
[im 13/29]
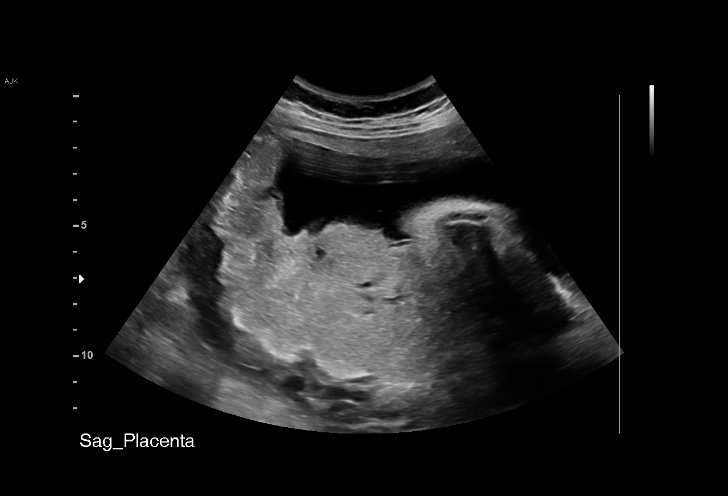
[im 15/29]
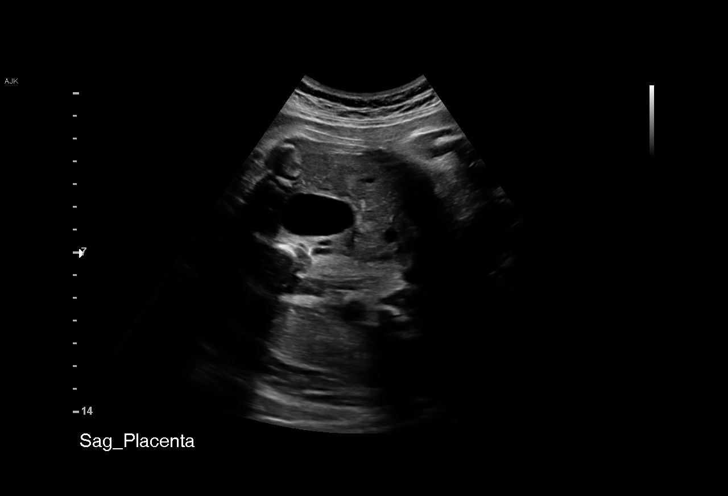
[im 16/29]
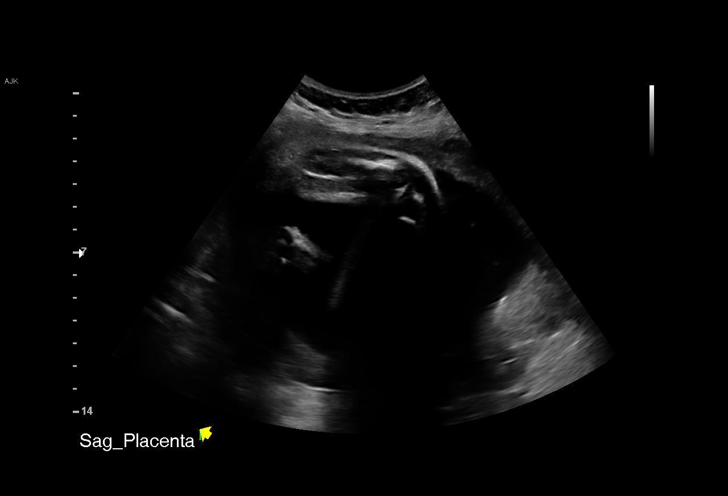
[im 18/29]
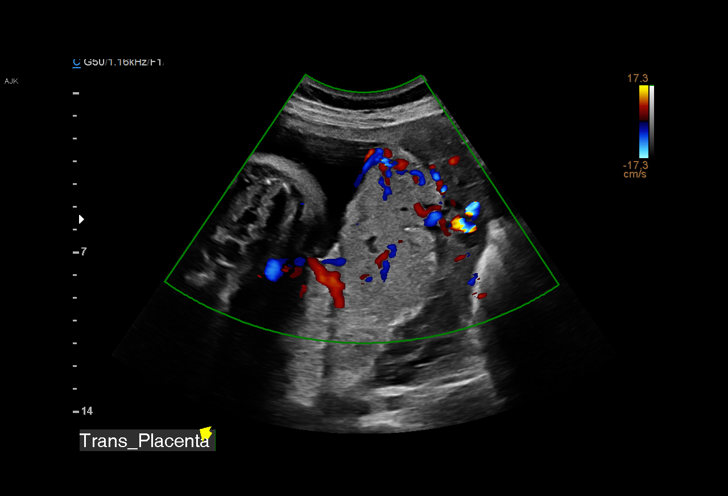
[im 20/29]
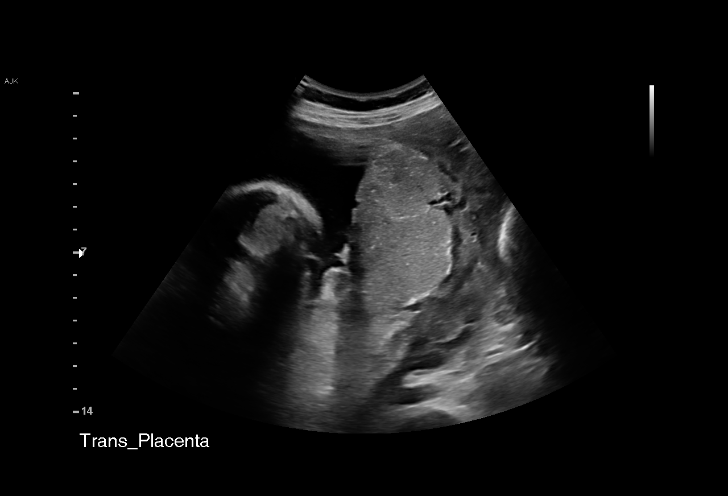
[im 22/29]
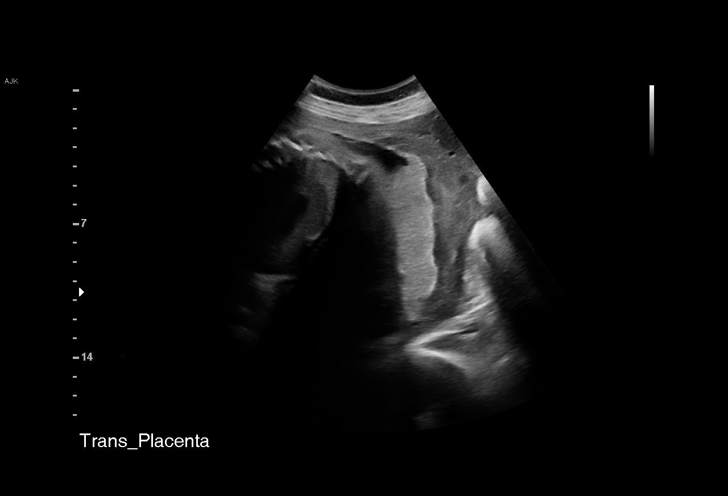
[im 24/29]
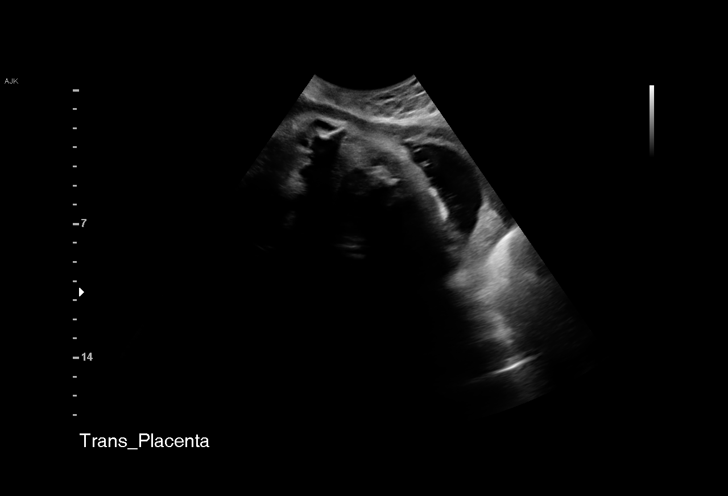
[im 26/29]
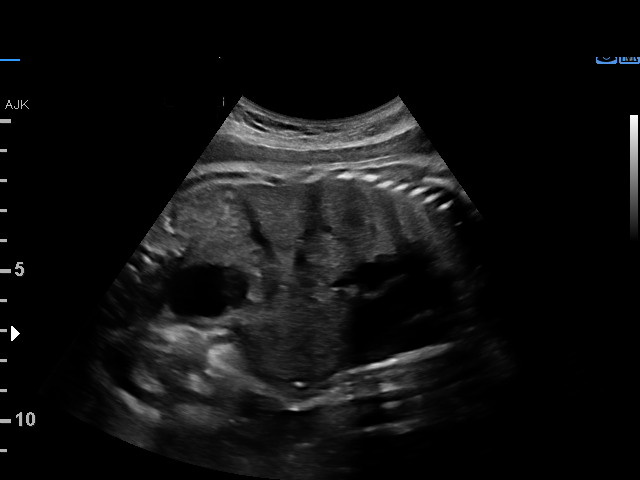
[im 29/29]
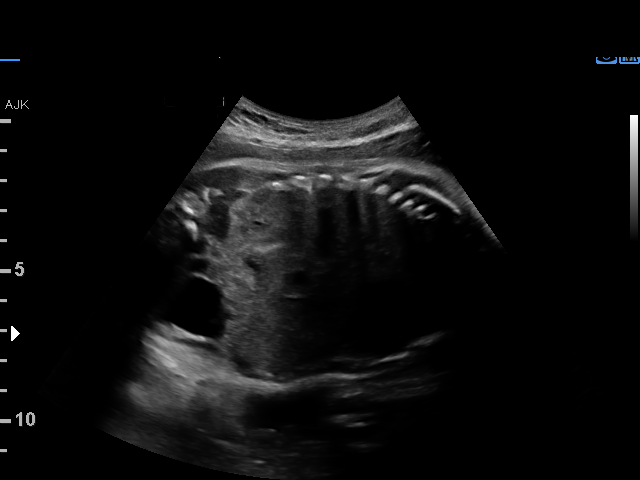

[15 of 28 positions shown; findings below may reference images not displayed]

[facility]/Gyn Clinic

 ----------------------------------------------------------------------

 ----------------------------------------------------------------------
Indications

  Hypertension - Gestational
  32 weeks gestation of pregnancy
 ----------------------------------------------------------------------
Vital Signs

                                                Height:        4'11"
Fetal Evaluation

 Num Of Fetuses:          1
 Fetal Heart Rate(bpm):   158
 Cardiac Activity:        Observed
 Presentation:            Cephalic
 Placenta:                Posterior

 Amniotic Fluid
 AFI FV:      Within normal limits

 AFI Sum(cm)     %Tile       Largest Pocket(cm)
 18.94           71

 RUQ(cm)       RLQ(cm)       LUQ(cm)        LLQ(cm)

Biophysical Evaluation
 Amniotic F.V:   Within normal limits       F. Tone:         Observed
 F. Movement:    Observed                   Score:           [DATE]
 F. Breathing:   Observed
OB History

 Gravidity:    1
Gestational Age

 LMP:           32w 3d        Date:  10/29/17                 EDD:   08/05/18
 Best:          32w 3d     Det. By:  LMP  (10/29/17)          EDD:   08/05/18
Impression

 Biophysical profile [DATE]
Recommendations

 Follow up growth in 2 weeks
 Continue weekly BPP.

## 2021-01-17 IMAGING — US US MFM OB FOLLOW-UP
1 series · 14 of 28 positions shown · non-contrast
Comparison: none

[Series 1: us mfm ob follow-up · 31 acquisitions, 14 frames shown]
[im 2/31]
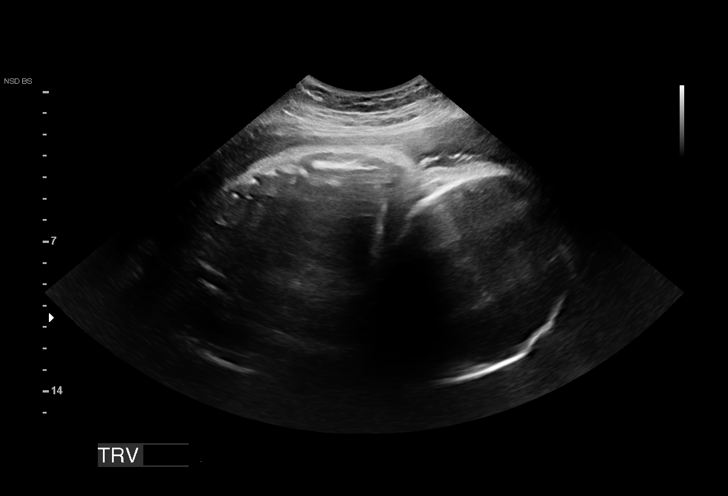
[im 4/31]
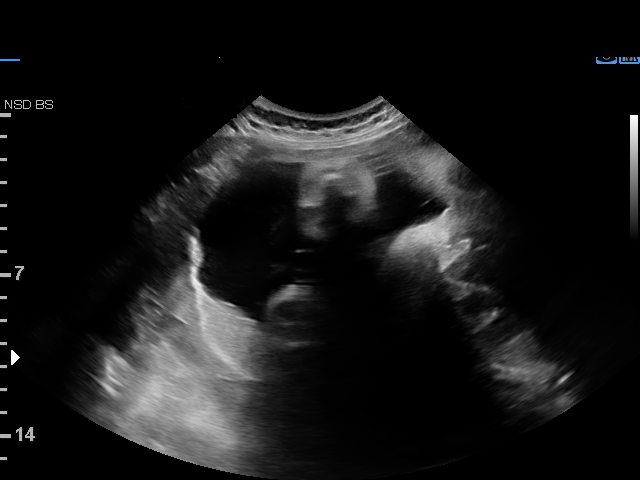
[im 6/31]
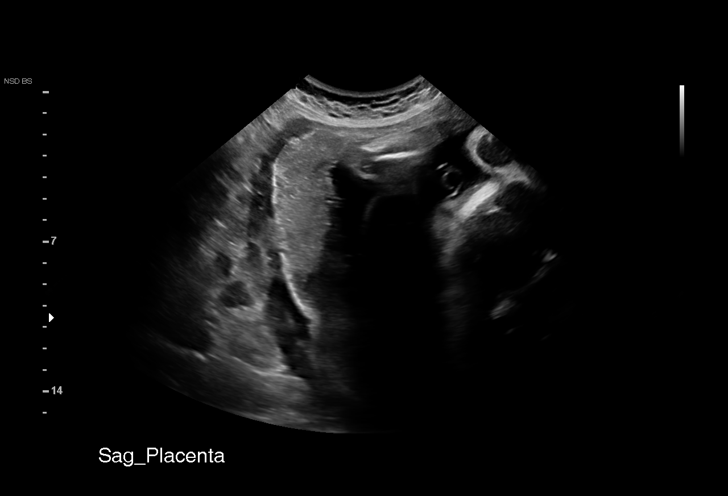
[im 8/31]
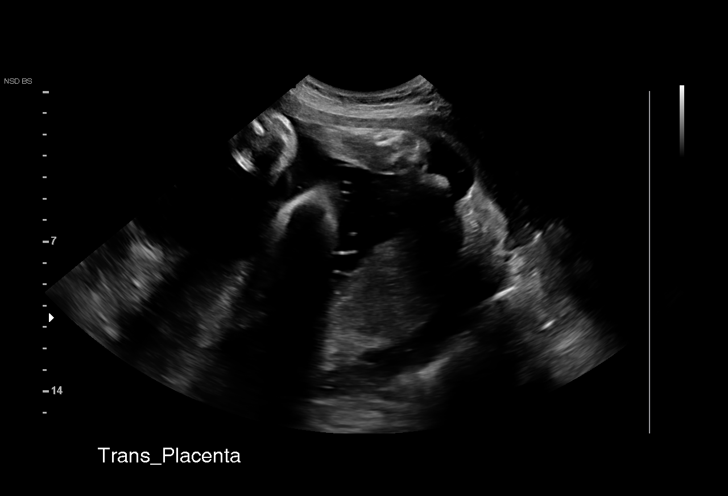
[im 11/31]
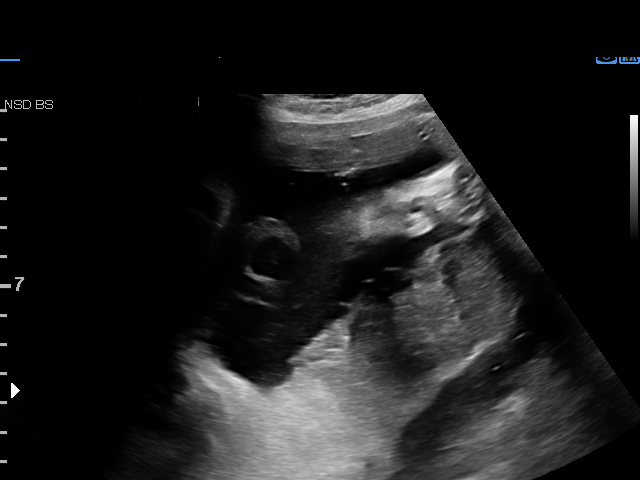
[im 13/31]
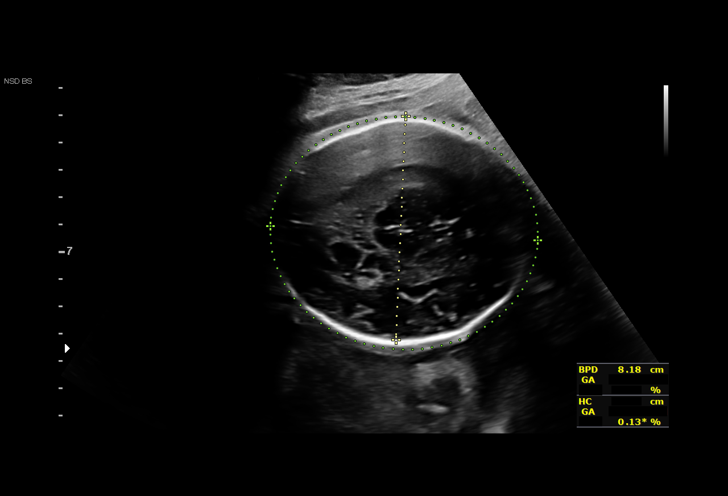
[im 15/31]
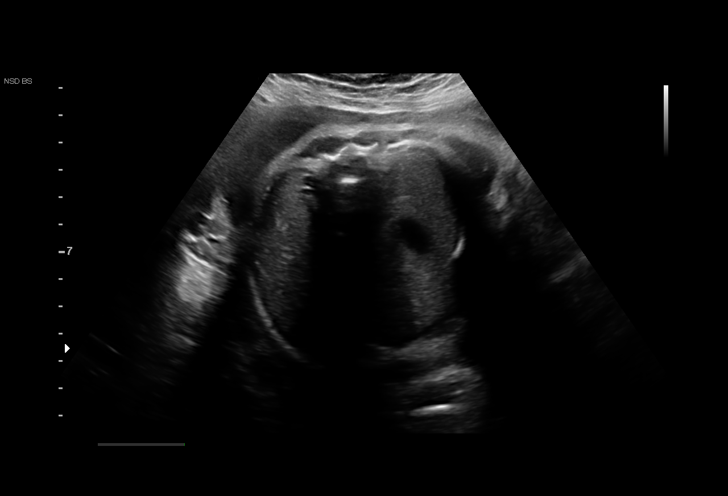
[im 17/31]
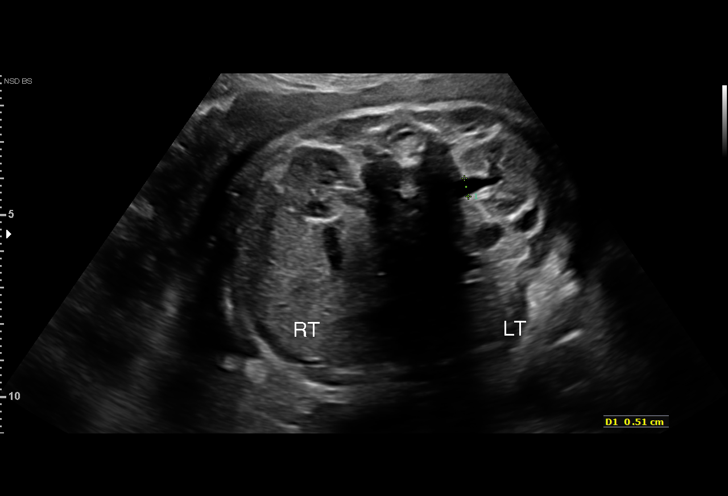
[im 19/31]
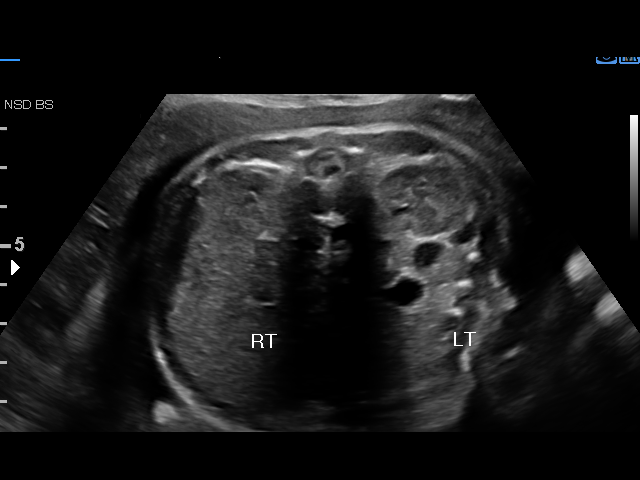
[im 22/31]
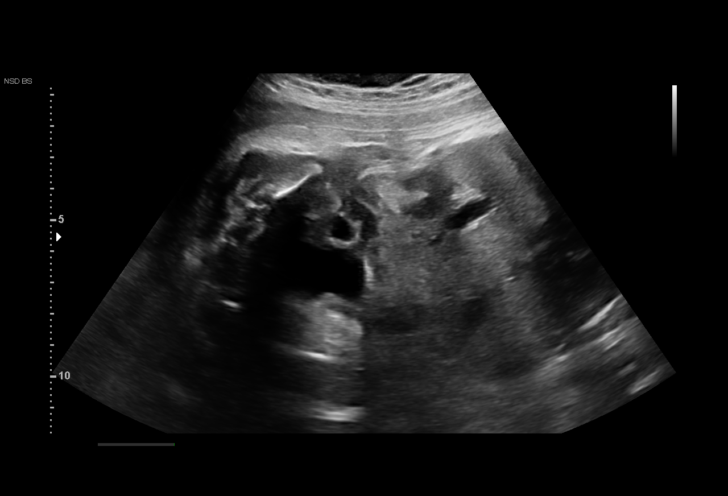
[im 24/31]
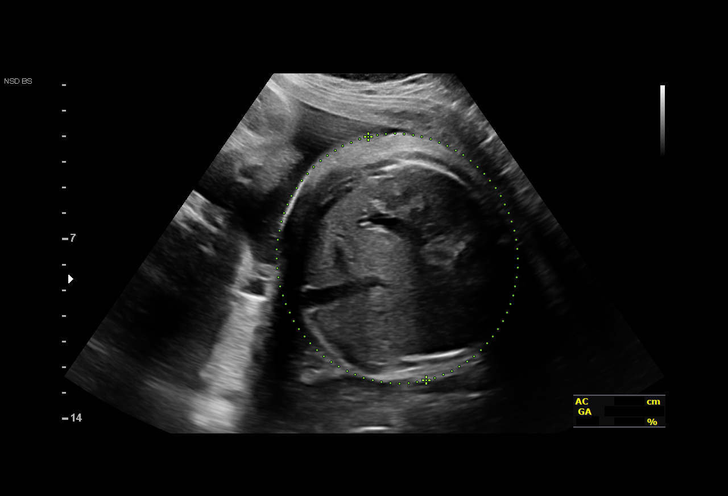
[im 26/31]
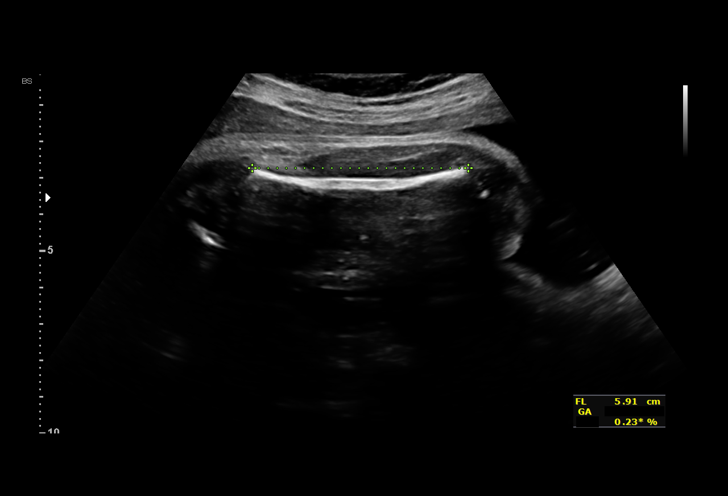
[im 28/31]
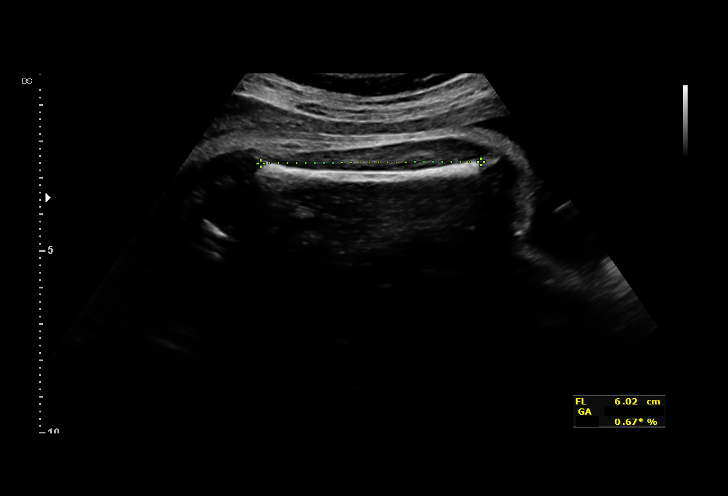
[im 31/31]
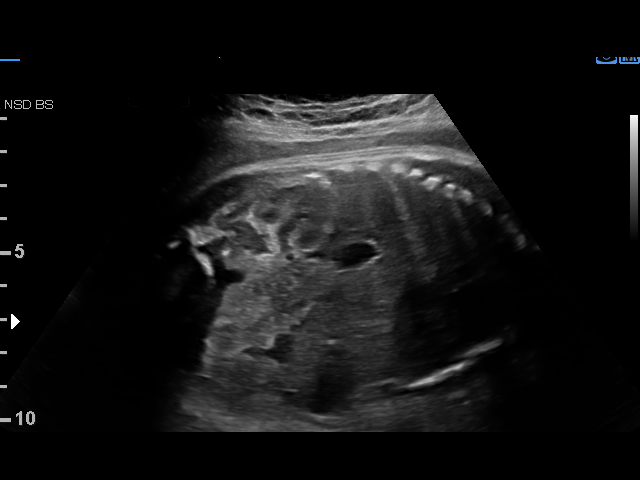

[14 of 28 positions shown; findings below may reference images not displayed]

----------------------------------------------------------------------

 ----------------------------------------------------------------------
Indications

  Hypertension - Gestational
  34 weeks gestation of pregnancy
  Encounter for other antenatal screening
  follow-up
 ----------------------------------------------------------------------
Vital Signs

                                                 Height:        4'11"
Fetal Evaluation

 Num Of Fetuses:          1
 Fetal Heart Rate(bpm):   150
 Cardiac Activity:        Observed
 Presentation:            Transverse, head to maternal left
 Placenta:                Posterior
 P. Cord Insertion:       Previously Visualized

 Amniotic Fluid
 AFI FV:      Within normal limits

 AFI Sum(cm)     %Tile       Largest Pocket(cm)
 20.38           76
 RUQ(cm)       RLQ(cm)        LUQ(cm)        LLQ(cm)

Biophysical Evaluation

 Amniotic F.V:   Within normal limits        F. Tone:         Observed
 F. Movement:    Observed                    Score:           [DATE]
 F. Breathing:   Observed
Biometry

 BPD:      82.1   mm     G. Age:  33w 0d         15  %    CI:         79.81  %    70 - 86
                                                          FL/HC:       20.9  %    19.4 -
 HC:      290.4   mm     G. Age:  32w 0d        < 3  %    HC/AC:       0.96       0.96 -
 AC:      302.1   mm     G. Age:  34w 1d         51  %    FL/BPD:      74.1  %    71 - 87
 FL:       60.8   mm     G. Age:  31w 4d        < 3  %    FL/AC:       20.1  %    20 - 24

 Est. FW:    3131   gm    4 lb 11 oz      37  %
OB History

 Gravidity:     1
Gestational Age

 LMP:            34w 2d       Date:  10/29/17                   EDD:  08/05/18
 U/S Today:      32w 5d                                         EDD:  08/16/18
 Best:           34w 2d    Det. By:  LMP  (10/29/17)            EDD:  08/05/18
Anatomy

 Cranium:                Appears normal         Aortic Arch:            Previously seen
 Cavum:                  Previously seen        Ductal Arch:            Previously seen
 Ventricles:             Previously seen        Diaphragm:              Previously seen
 Choroid Plexus:         Previously seen        Stomach:                Appears normal, left
                                                                        sided
 Cerebellum:             Previously seen        Abdomen:                Previously seen
 Posterior Fossa:        Previously seen        Abdominal Wall:         Previously seen
 Nuchal Fold:            Previously seen        Cord Vessels:           Previously seen
 Face:                   Orbits and profile     Kidneys:                Lt. Pyelectasis
                         previously seen                                resolved
 Lips:                   Previously seen        Bladder:                Appears normal
 Thoracic:               Appears normal         Spine:                  Previously seen
 Heart:                  Previously seen        Upper Extremities:      Previously seen
 RVOT:                   Previously seen        Lower Extremities:      Previously seen
 LVOT:                   Previously seen

 Other:   Female gender. Heels and 5th digit prev visualized.
Cervix Uterus Adnexa

 Cervix
 Not visualized (advanced GA >22wks)
Impression

 Normal interval growth.
 Elevated blood pressure (140/101 repeat 160/90's) with
 maternal tachycardia 206bpm
 Resolved unilateral pyelectasis
Recommendations

 Repeat growth in 3-4 weeks
 Consider weekly testing with BPP/NST

## 2021-01-24 IMAGING — US US FETAL BPP W/ NON-STRESS
1 series · 13 of 15 positions shown · non-contrast
Comparison: none

[Series 1: us fetal bpp w/nonstress · 15 acquisitions, 13 frames shown]
[im 1/15]
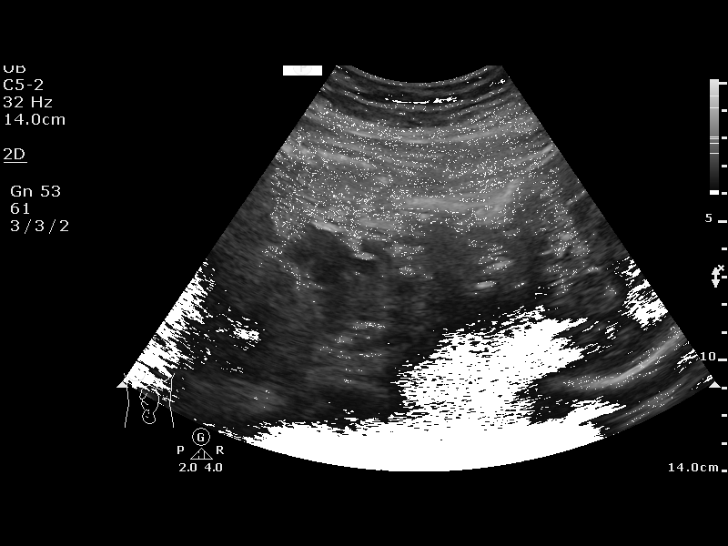
[im 2/15]
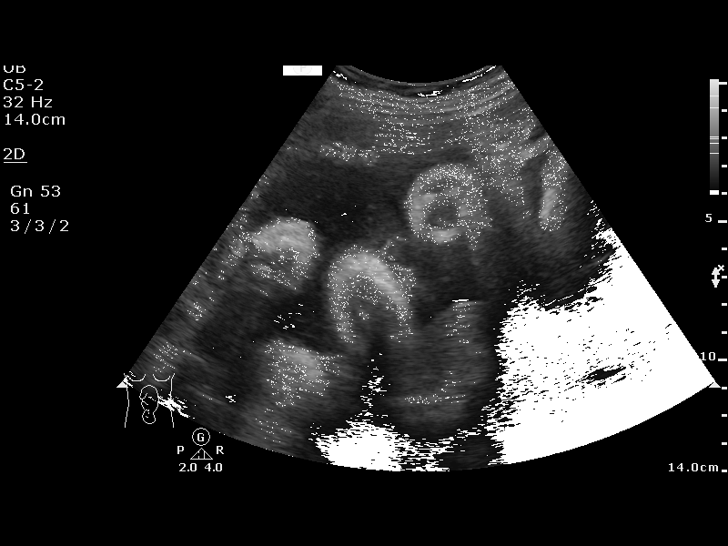
[im 3/15]
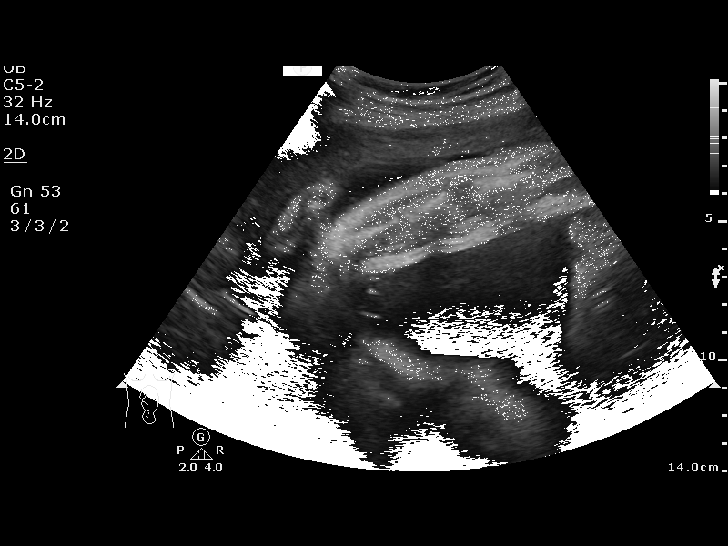
[im 5/15]
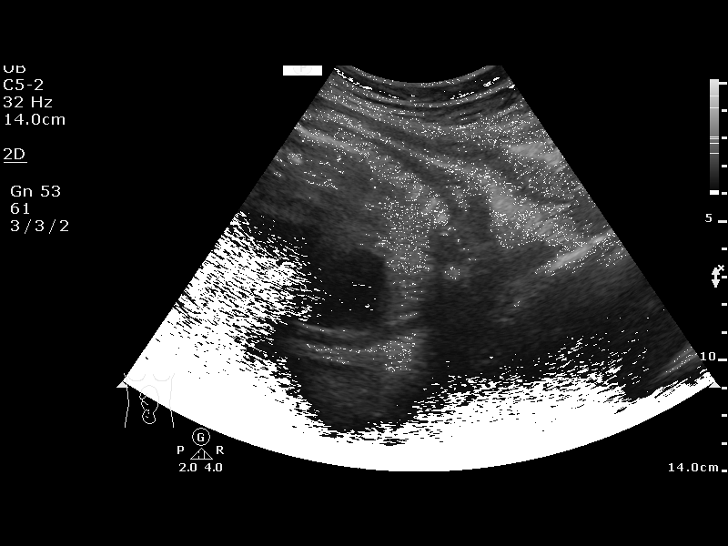
[im 6/15]
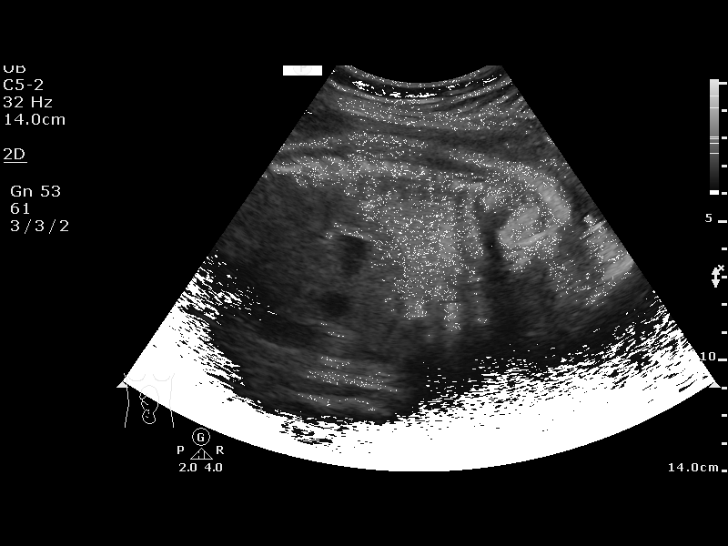
[im 7/15]
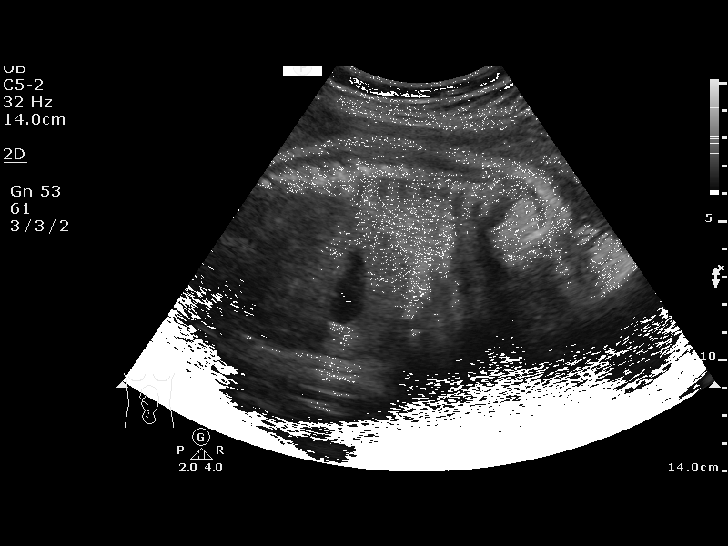
[im 8/15]
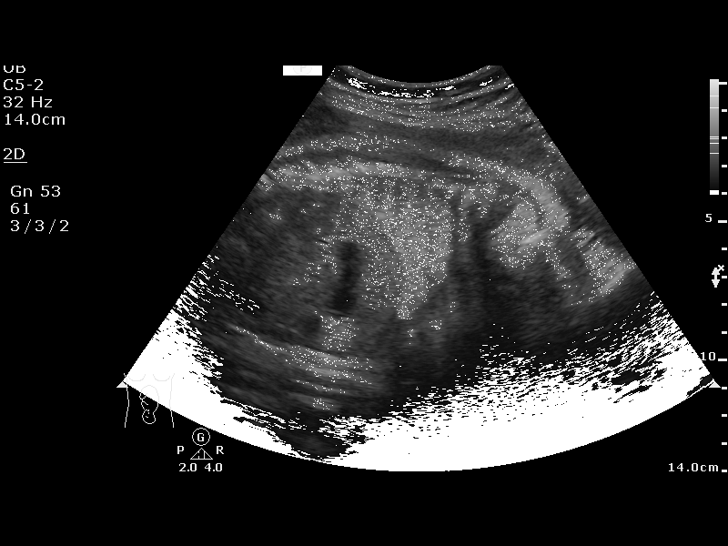
[im 9/15]
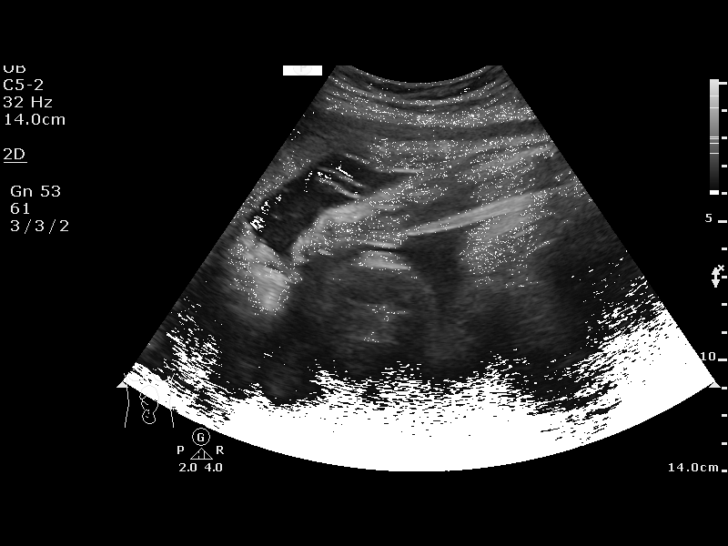
[im 10/15]
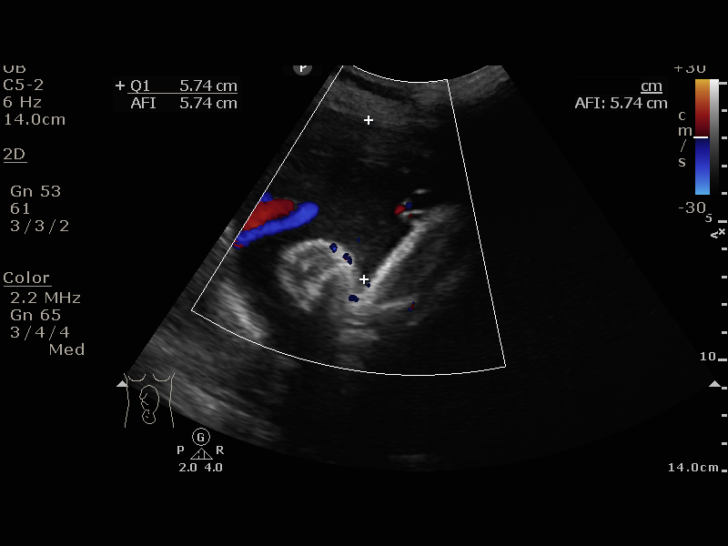
[im 11/15]
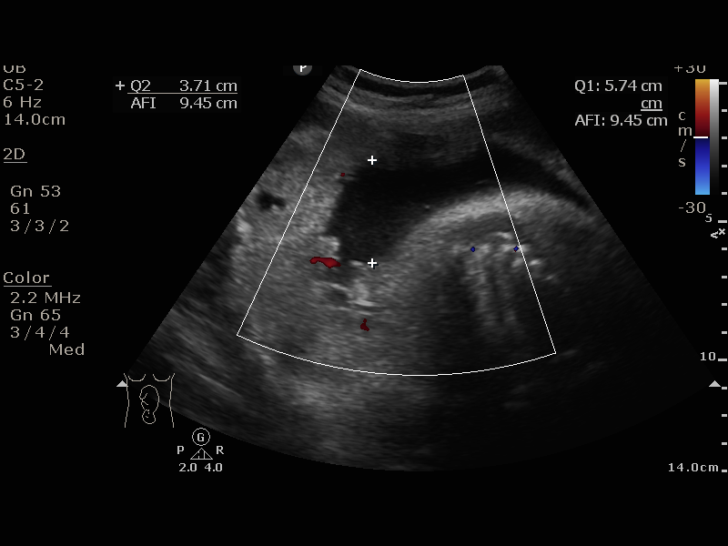
[im 13/15]
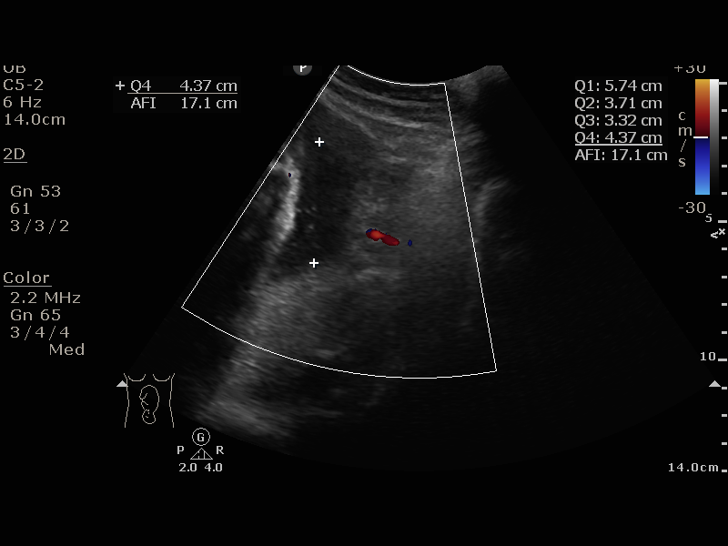
[im 14/15]
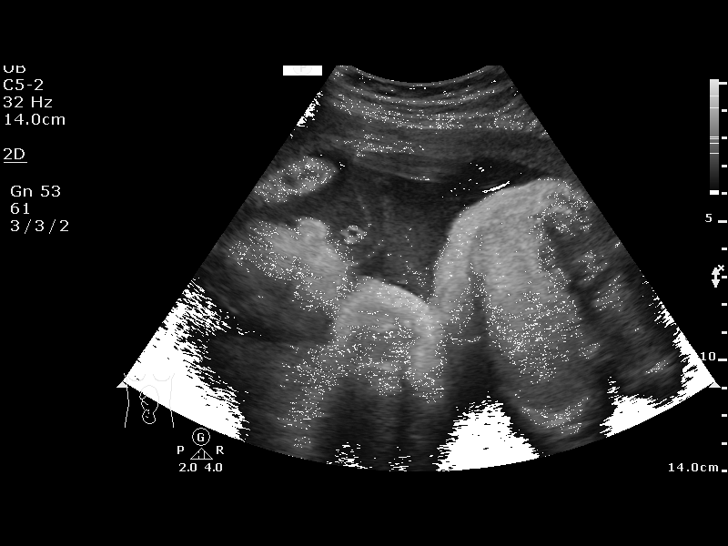
[im 15/15]
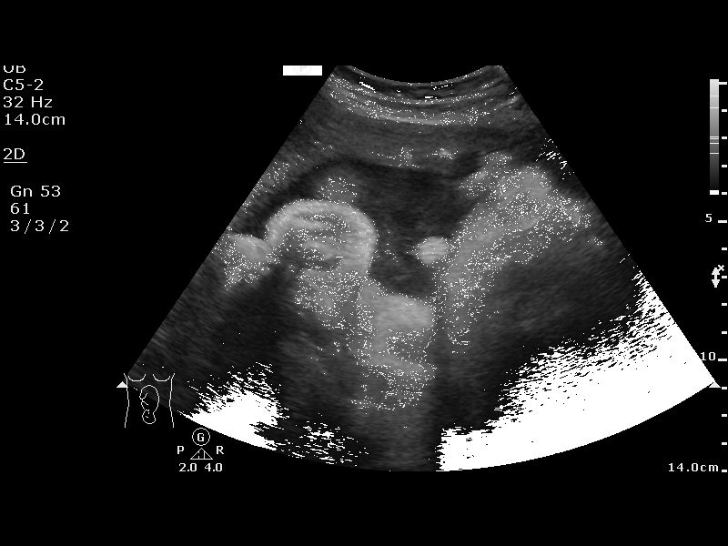

[13 of 15 positions shown; findings below may reference images not displayed]

[facility]yn Clinic
                                                            [REDACTED]care

  1  US FETAL BPP W/NONSTRESS             76818.4      PROFESORIUS GON[facility]
 ----------------------------------------------------------------------

 ----------------------------------------------------------------------
Service(s) Provided

 ----------------------------------------------------------------------
Indications

  35 weeks gestation of pregnancy
  Hypertension - Gestational
 ----------------------------------------------------------------------
Vital Signs

                                                Height:        4'11"
Fetal Evaluation

 Num Of Fetuses:         1
 Preg. Location:         Intrauterine
 Cardiac Activity:       Observed
 Presentation:           Cephalic

 Amniotic Fluid
 AFI FV:      Within normal limits

 AFI Sum(cm)     %Tile       Largest Pocket(cm)
 17.14           63

 RUQ(cm)       RLQ(cm)       LUQ(cm)        LLQ(cm)

Biophysical Evaluation

 Amniotic F.V:   Pocket => 2 cm two         F. Tone:        Observed
                 planes
 F. Movement:    Observed                   N.S.T:          Reactive
 F. Breathing:   Observed                   Score:          [DATE]
OB History

 Gravidity:    1
Gestational Age

 LMP:           35w 2d        Date:  10/29/17                 EDD:   08/05/18
 Best:          35w 2d     Det. By:  LMP  (10/29/17)          EDD:   08/05/18
Impression

 Gestational hypertenstion at 35 weeks- reassuring testing
Recommendations

 Continue testing until delivery

                  Ganden, Enoyy
